# Patient Record
Sex: Female | Born: 1940 | Race: White | Hispanic: No | Marital: Married | Smoking: Never smoker
Health system: Southern US, Community
[De-identification: ages and names within clinical notes are randomized; demographics above are authoritative.]

## PROBLEM LIST (undated history)

## (undated) DIAGNOSIS — R5381 Other malaise: Secondary | ICD-10-CM

## (undated) DIAGNOSIS — R413 Other amnesia: Secondary | ICD-10-CM

## (undated) DIAGNOSIS — I10 Essential (primary) hypertension: Secondary | ICD-10-CM

## (undated) DIAGNOSIS — F32A Depression, unspecified: Secondary | ICD-10-CM

## (undated) DIAGNOSIS — R5383 Other fatigue: Secondary | ICD-10-CM

## (undated) DIAGNOSIS — E785 Hyperlipidemia, unspecified: Secondary | ICD-10-CM

## (undated) DIAGNOSIS — R251 Tremor, unspecified: Secondary | ICD-10-CM

## (undated) DIAGNOSIS — E559 Vitamin D deficiency, unspecified: Secondary | ICD-10-CM

## (undated) DIAGNOSIS — I6529 Occlusion and stenosis of unspecified carotid artery: Secondary | ICD-10-CM

## (undated) DIAGNOSIS — M810 Age-related osteoporosis without current pathological fracture: Secondary | ICD-10-CM

## (undated) DIAGNOSIS — F329 Major depressive disorder, single episode, unspecified: Secondary | ICD-10-CM

## (undated) HISTORY — DX: Other fatigue: R53.83

## (undated) HISTORY — DX: Hyperlipidemia, unspecified: E78.5

## (undated) HISTORY — DX: Major depressive disorder, single episode, unspecified: F32.9

## (undated) HISTORY — DX: Other amnesia: R41.3

## (undated) HISTORY — DX: Other malaise: R53.81

## (undated) HISTORY — DX: Occlusion and stenosis of unspecified carotid artery: I65.29

## (undated) HISTORY — PX: ARTHROSCOPY KNEE W/ DRILLING: SUR92

## (undated) HISTORY — DX: Depression, unspecified: F32.A

## (undated) HISTORY — DX: Vitamin D deficiency, unspecified: E55.9

## (undated) HISTORY — DX: Age-related osteoporosis without current pathological fracture: M81.0

## (undated) HISTORY — DX: Tremor, unspecified: R25.1

## (undated) HISTORY — DX: Essential (primary) hypertension: I10

---

## 2012-01-07 DIAGNOSIS — Z6826 Body mass index (BMI) 26.0-26.9, adult: Secondary | ICD-10-CM | POA: Diagnosis not present

## 2012-01-07 DIAGNOSIS — M810 Age-related osteoporosis without current pathological fracture: Secondary | ICD-10-CM | POA: Diagnosis not present

## 2012-01-07 DIAGNOSIS — E785 Hyperlipidemia, unspecified: Secondary | ICD-10-CM | POA: Diagnosis not present

## 2012-01-07 DIAGNOSIS — I10 Essential (primary) hypertension: Secondary | ICD-10-CM | POA: Diagnosis not present

## 2012-02-06 DIAGNOSIS — H35039 Hypertensive retinopathy, unspecified eye: Secondary | ICD-10-CM | POA: Diagnosis not present

## 2012-02-06 DIAGNOSIS — H2510 Age-related nuclear cataract, unspecified eye: Secondary | ICD-10-CM | POA: Diagnosis not present

## 2012-02-20 DIAGNOSIS — Z1231 Encounter for screening mammogram for malignant neoplasm of breast: Secondary | ICD-10-CM | POA: Diagnosis not present

## 2012-02-20 DIAGNOSIS — M899 Disorder of bone, unspecified: Secondary | ICD-10-CM | POA: Diagnosis not present

## 2012-05-10 DIAGNOSIS — M810 Age-related osteoporosis without current pathological fracture: Secondary | ICD-10-CM | POA: Diagnosis not present

## 2012-05-10 DIAGNOSIS — I10 Essential (primary) hypertension: Secondary | ICD-10-CM | POA: Diagnosis not present

## 2012-05-10 DIAGNOSIS — Z6826 Body mass index (BMI) 26.0-26.9, adult: Secondary | ICD-10-CM | POA: Diagnosis not present

## 2012-05-10 DIAGNOSIS — E785 Hyperlipidemia, unspecified: Secondary | ICD-10-CM | POA: Diagnosis not present

## 2012-09-10 DIAGNOSIS — E785 Hyperlipidemia, unspecified: Secondary | ICD-10-CM | POA: Diagnosis not present

## 2012-09-10 DIAGNOSIS — Z6825 Body mass index (BMI) 25.0-25.9, adult: Secondary | ICD-10-CM | POA: Diagnosis not present

## 2012-09-10 DIAGNOSIS — I10 Essential (primary) hypertension: Secondary | ICD-10-CM | POA: Diagnosis not present

## 2013-01-14 DIAGNOSIS — M810 Age-related osteoporosis without current pathological fracture: Secondary | ICD-10-CM | POA: Diagnosis not present

## 2013-01-14 DIAGNOSIS — E785 Hyperlipidemia, unspecified: Secondary | ICD-10-CM | POA: Diagnosis not present

## 2013-01-14 DIAGNOSIS — Z6825 Body mass index (BMI) 25.0-25.9, adult: Secondary | ICD-10-CM | POA: Diagnosis not present

## 2013-01-14 DIAGNOSIS — Z0000 Encounter for general adult medical examination without abnormal findings: Secondary | ICD-10-CM | POA: Diagnosis not present

## 2013-01-14 DIAGNOSIS — I10 Essential (primary) hypertension: Secondary | ICD-10-CM | POA: Diagnosis not present

## 2013-01-19 DIAGNOSIS — I658 Occlusion and stenosis of other precerebral arteries: Secondary | ICD-10-CM | POA: Diagnosis not present

## 2013-01-19 DIAGNOSIS — I6529 Occlusion and stenosis of unspecified carotid artery: Secondary | ICD-10-CM | POA: Diagnosis not present

## 2013-02-21 DIAGNOSIS — Z1231 Encounter for screening mammogram for malignant neoplasm of breast: Secondary | ICD-10-CM | POA: Diagnosis not present

## 2013-05-17 DIAGNOSIS — Z6826 Body mass index (BMI) 26.0-26.9, adult: Secondary | ICD-10-CM | POA: Diagnosis not present

## 2013-05-17 DIAGNOSIS — T783XXA Angioneurotic edema, initial encounter: Secondary | ICD-10-CM | POA: Diagnosis not present

## 2013-05-19 DIAGNOSIS — L500 Allergic urticaria: Secondary | ICD-10-CM | POA: Diagnosis not present

## 2013-05-19 DIAGNOSIS — T783XXA Angioneurotic edema, initial encounter: Secondary | ICD-10-CM | POA: Diagnosis not present

## 2013-05-24 DIAGNOSIS — Z6826 Body mass index (BMI) 26.0-26.9, adult: Secondary | ICD-10-CM | POA: Diagnosis not present

## 2013-05-24 DIAGNOSIS — Z9181 History of falling: Secondary | ICD-10-CM | POA: Diagnosis not present

## 2013-05-24 DIAGNOSIS — Z1331 Encounter for screening for depression: Secondary | ICD-10-CM | POA: Diagnosis not present

## 2013-05-24 DIAGNOSIS — E785 Hyperlipidemia, unspecified: Secondary | ICD-10-CM | POA: Diagnosis not present

## 2013-05-24 DIAGNOSIS — I10 Essential (primary) hypertension: Secondary | ICD-10-CM | POA: Diagnosis not present

## 2013-05-24 DIAGNOSIS — E559 Vitamin D deficiency, unspecified: Secondary | ICD-10-CM | POA: Diagnosis not present

## 2013-06-06 DIAGNOSIS — T783XXA Angioneurotic edema, initial encounter: Secondary | ICD-10-CM | POA: Diagnosis not present

## 2013-06-29 DIAGNOSIS — T783XXA Angioneurotic edema, initial encounter: Secondary | ICD-10-CM | POA: Diagnosis not present

## 2013-07-01 DIAGNOSIS — R1013 Epigastric pain: Secondary | ICD-10-CM | POA: Diagnosis not present

## 2013-07-01 DIAGNOSIS — T783XXA Angioneurotic edema, initial encounter: Secondary | ICD-10-CM | POA: Diagnosis not present

## 2013-07-01 DIAGNOSIS — E069 Thyroiditis, unspecified: Secondary | ICD-10-CM | POA: Diagnosis not present

## 2013-07-01 DIAGNOSIS — T887XXA Unspecified adverse effect of drug or medicament, initial encounter: Secondary | ICD-10-CM | POA: Diagnosis not present

## 2013-07-01 DIAGNOSIS — K3189 Other diseases of stomach and duodenum: Secondary | ICD-10-CM | POA: Diagnosis not present

## 2013-09-28 DIAGNOSIS — E559 Vitamin D deficiency, unspecified: Secondary | ICD-10-CM | POA: Diagnosis not present

## 2013-09-28 DIAGNOSIS — I10 Essential (primary) hypertension: Secondary | ICD-10-CM | POA: Diagnosis not present

## 2013-09-28 DIAGNOSIS — M810 Age-related osteoporosis without current pathological fracture: Secondary | ICD-10-CM | POA: Diagnosis not present

## 2013-09-28 DIAGNOSIS — E785 Hyperlipidemia, unspecified: Secondary | ICD-10-CM | POA: Diagnosis not present

## 2013-12-06 DIAGNOSIS — H2510 Age-related nuclear cataract, unspecified eye: Secondary | ICD-10-CM | POA: Diagnosis not present

## 2014-02-02 DIAGNOSIS — Z0000 Encounter for general adult medical examination without abnormal findings: Secondary | ICD-10-CM | POA: Diagnosis not present

## 2014-02-02 DIAGNOSIS — Z79899 Other long term (current) drug therapy: Secondary | ICD-10-CM | POA: Diagnosis not present

## 2014-02-02 DIAGNOSIS — E785 Hyperlipidemia, unspecified: Secondary | ICD-10-CM | POA: Diagnosis not present

## 2014-02-02 DIAGNOSIS — Z1212 Encounter for screening for malignant neoplasm of rectum: Secondary | ICD-10-CM | POA: Diagnosis not present

## 2014-02-02 DIAGNOSIS — Z6826 Body mass index (BMI) 26.0-26.9, adult: Secondary | ICD-10-CM | POA: Diagnosis not present

## 2014-02-02 DIAGNOSIS — I10 Essential (primary) hypertension: Secondary | ICD-10-CM | POA: Diagnosis not present

## 2014-03-03 DIAGNOSIS — Z1231 Encounter for screening mammogram for malignant neoplasm of breast: Secondary | ICD-10-CM | POA: Diagnosis not present

## 2014-03-03 DIAGNOSIS — M899 Disorder of bone, unspecified: Secondary | ICD-10-CM | POA: Diagnosis not present

## 2014-03-03 DIAGNOSIS — M949 Disorder of cartilage, unspecified: Secondary | ICD-10-CM | POA: Diagnosis not present

## 2014-06-05 DIAGNOSIS — E785 Hyperlipidemia, unspecified: Secondary | ICD-10-CM | POA: Diagnosis not present

## 2014-06-05 DIAGNOSIS — M810 Age-related osteoporosis without current pathological fracture: Secondary | ICD-10-CM | POA: Diagnosis not present

## 2014-06-05 DIAGNOSIS — E559 Vitamin D deficiency, unspecified: Secondary | ICD-10-CM | POA: Diagnosis not present

## 2014-06-05 DIAGNOSIS — I10 Essential (primary) hypertension: Secondary | ICD-10-CM | POA: Diagnosis not present

## 2014-10-17 DIAGNOSIS — M810 Age-related osteoporosis without current pathological fracture: Secondary | ICD-10-CM | POA: Diagnosis not present

## 2014-10-17 DIAGNOSIS — Z6827 Body mass index (BMI) 27.0-27.9, adult: Secondary | ICD-10-CM | POA: Diagnosis not present

## 2014-10-17 DIAGNOSIS — I10 Essential (primary) hypertension: Secondary | ICD-10-CM | POA: Diagnosis not present

## 2014-10-17 DIAGNOSIS — E559 Vitamin D deficiency, unspecified: Secondary | ICD-10-CM | POA: Diagnosis not present

## 2014-10-17 DIAGNOSIS — Z23 Encounter for immunization: Secondary | ICD-10-CM | POA: Diagnosis not present

## 2014-10-17 DIAGNOSIS — E785 Hyperlipidemia, unspecified: Secondary | ICD-10-CM | POA: Diagnosis not present

## 2015-02-20 DIAGNOSIS — Z1389 Encounter for screening for other disorder: Secondary | ICD-10-CM | POA: Diagnosis not present

## 2015-02-20 DIAGNOSIS — E785 Hyperlipidemia, unspecified: Secondary | ICD-10-CM | POA: Diagnosis not present

## 2015-02-20 DIAGNOSIS — Z6827 Body mass index (BMI) 27.0-27.9, adult: Secondary | ICD-10-CM | POA: Diagnosis not present

## 2015-02-20 DIAGNOSIS — N289 Disorder of kidney and ureter, unspecified: Secondary | ICD-10-CM | POA: Diagnosis not present

## 2015-02-20 DIAGNOSIS — Z9181 History of falling: Secondary | ICD-10-CM | POA: Diagnosis not present

## 2015-02-20 DIAGNOSIS — I10 Essential (primary) hypertension: Secondary | ICD-10-CM | POA: Diagnosis not present

## 2015-02-20 DIAGNOSIS — Z1231 Encounter for screening mammogram for malignant neoplasm of breast: Secondary | ICD-10-CM | POA: Diagnosis not present

## 2015-02-20 DIAGNOSIS — M810 Age-related osteoporosis without current pathological fracture: Secondary | ICD-10-CM | POA: Diagnosis not present

## 2015-03-05 DIAGNOSIS — Z1231 Encounter for screening mammogram for malignant neoplasm of breast: Secondary | ICD-10-CM | POA: Diagnosis not present

## 2015-04-02 DIAGNOSIS — Z6827 Body mass index (BMI) 27.0-27.9, adult: Secondary | ICD-10-CM | POA: Diagnosis not present

## 2015-04-02 DIAGNOSIS — J309 Allergic rhinitis, unspecified: Secondary | ICD-10-CM | POA: Diagnosis not present

## 2015-04-02 DIAGNOSIS — R05 Cough: Secondary | ICD-10-CM | POA: Diagnosis not present

## 2015-06-26 DIAGNOSIS — I6523 Occlusion and stenosis of bilateral carotid arteries: Secondary | ICD-10-CM | POA: Diagnosis not present

## 2015-06-26 DIAGNOSIS — Z9181 History of falling: Secondary | ICD-10-CM | POA: Diagnosis not present

## 2015-06-26 DIAGNOSIS — I10 Essential (primary) hypertension: Secondary | ICD-10-CM | POA: Diagnosis not present

## 2015-06-26 DIAGNOSIS — E559 Vitamin D deficiency, unspecified: Secondary | ICD-10-CM | POA: Diagnosis not present

## 2015-06-26 DIAGNOSIS — Z6826 Body mass index (BMI) 26.0-26.9, adult: Secondary | ICD-10-CM | POA: Diagnosis not present

## 2015-06-26 DIAGNOSIS — Z23 Encounter for immunization: Secondary | ICD-10-CM | POA: Diagnosis not present

## 2015-06-26 DIAGNOSIS — Z1231 Encounter for screening mammogram for malignant neoplasm of breast: Secondary | ICD-10-CM | POA: Diagnosis not present

## 2015-06-26 DIAGNOSIS — Z0000 Encounter for general adult medical examination without abnormal findings: Secondary | ICD-10-CM | POA: Diagnosis not present

## 2015-06-26 DIAGNOSIS — Z1389 Encounter for screening for other disorder: Secondary | ICD-10-CM | POA: Diagnosis not present

## 2015-06-27 DIAGNOSIS — N289 Disorder of kidney and ureter, unspecified: Secondary | ICD-10-CM | POA: Diagnosis not present

## 2015-07-03 DIAGNOSIS — I6523 Occlusion and stenosis of bilateral carotid arteries: Secondary | ICD-10-CM | POA: Diagnosis not present

## 2015-10-08 DIAGNOSIS — Z6827 Body mass index (BMI) 27.0-27.9, adult: Secondary | ICD-10-CM | POA: Diagnosis not present

## 2015-10-08 DIAGNOSIS — J208 Acute bronchitis due to other specified organisms: Secondary | ICD-10-CM | POA: Diagnosis not present

## 2015-10-17 DIAGNOSIS — Z6826 Body mass index (BMI) 26.0-26.9, adult: Secondary | ICD-10-CM | POA: Diagnosis not present

## 2015-10-17 DIAGNOSIS — R0989 Other specified symptoms and signs involving the circulatory and respiratory systems: Secondary | ICD-10-CM | POA: Diagnosis not present

## 2015-10-17 DIAGNOSIS — R918 Other nonspecific abnormal finding of lung field: Secondary | ICD-10-CM | POA: Diagnosis not present

## 2015-10-17 DIAGNOSIS — J208 Acute bronchitis due to other specified organisms: Secondary | ICD-10-CM | POA: Diagnosis not present

## 2015-10-19 DIAGNOSIS — I2510 Atherosclerotic heart disease of native coronary artery without angina pectoris: Secondary | ICD-10-CM | POA: Diagnosis not present

## 2015-10-19 DIAGNOSIS — N261 Atrophy of kidney (terminal): Secondary | ICD-10-CM | POA: Diagnosis not present

## 2015-10-19 DIAGNOSIS — R918 Other nonspecific abnormal finding of lung field: Secondary | ICD-10-CM | POA: Diagnosis not present

## 2015-10-22 DIAGNOSIS — Z6827 Body mass index (BMI) 27.0-27.9, adult: Secondary | ICD-10-CM | POA: Diagnosis not present

## 2015-10-22 DIAGNOSIS — J189 Pneumonia, unspecified organism: Secondary | ICD-10-CM | POA: Diagnosis not present

## 2015-10-31 DIAGNOSIS — J309 Allergic rhinitis, unspecified: Secondary | ICD-10-CM | POA: Diagnosis not present

## 2015-10-31 DIAGNOSIS — I10 Essential (primary) hypertension: Secondary | ICD-10-CM | POA: Diagnosis not present

## 2015-10-31 DIAGNOSIS — M79602 Pain in left arm: Secondary | ICD-10-CM | POA: Diagnosis not present

## 2015-10-31 DIAGNOSIS — J189 Pneumonia, unspecified organism: Secondary | ICD-10-CM | POA: Diagnosis not present

## 2015-10-31 DIAGNOSIS — Z6827 Body mass index (BMI) 27.0-27.9, adult: Secondary | ICD-10-CM | POA: Diagnosis not present

## 2015-10-31 DIAGNOSIS — N289 Disorder of kidney and ureter, unspecified: Secondary | ICD-10-CM | POA: Diagnosis not present

## 2015-10-31 DIAGNOSIS — E785 Hyperlipidemia, unspecified: Secondary | ICD-10-CM | POA: Diagnosis not present

## 2015-12-12 DIAGNOSIS — J208 Acute bronchitis due to other specified organisms: Secondary | ICD-10-CM | POA: Diagnosis not present

## 2015-12-12 DIAGNOSIS — Z6827 Body mass index (BMI) 27.0-27.9, adult: Secondary | ICD-10-CM | POA: Diagnosis not present

## 2016-01-23 DIAGNOSIS — Z6827 Body mass index (BMI) 27.0-27.9, adult: Secondary | ICD-10-CM | POA: Diagnosis not present

## 2016-01-23 DIAGNOSIS — R413 Other amnesia: Secondary | ICD-10-CM | POA: Diagnosis not present

## 2016-01-23 DIAGNOSIS — F419 Anxiety disorder, unspecified: Secondary | ICD-10-CM | POA: Diagnosis not present

## 2016-01-23 DIAGNOSIS — G250 Essential tremor: Secondary | ICD-10-CM | POA: Diagnosis not present

## 2016-01-23 DIAGNOSIS — I10 Essential (primary) hypertension: Secondary | ICD-10-CM | POA: Diagnosis not present

## 2016-03-14 DIAGNOSIS — I10 Essential (primary) hypertension: Secondary | ICD-10-CM | POA: Diagnosis not present

## 2016-03-14 DIAGNOSIS — E785 Hyperlipidemia, unspecified: Secondary | ICD-10-CM | POA: Diagnosis not present

## 2016-03-14 DIAGNOSIS — N289 Disorder of kidney and ureter, unspecified: Secondary | ICD-10-CM | POA: Diagnosis not present

## 2016-03-14 DIAGNOSIS — Z1231 Encounter for screening mammogram for malignant neoplasm of breast: Secondary | ICD-10-CM | POA: Diagnosis not present

## 2016-03-14 DIAGNOSIS — J309 Allergic rhinitis, unspecified: Secondary | ICD-10-CM | POA: Diagnosis not present

## 2016-03-14 DIAGNOSIS — G250 Essential tremor: Secondary | ICD-10-CM | POA: Diagnosis not present

## 2016-03-14 DIAGNOSIS — Z6827 Body mass index (BMI) 27.0-27.9, adult: Secondary | ICD-10-CM | POA: Diagnosis not present

## 2016-04-04 DIAGNOSIS — Z1231 Encounter for screening mammogram for malignant neoplasm of breast: Secondary | ICD-10-CM | POA: Diagnosis not present

## 2016-06-20 DIAGNOSIS — N289 Disorder of kidney and ureter, unspecified: Secondary | ICD-10-CM | POA: Diagnosis not present

## 2016-06-20 DIAGNOSIS — J309 Allergic rhinitis, unspecified: Secondary | ICD-10-CM | POA: Diagnosis not present

## 2016-06-20 DIAGNOSIS — E559 Vitamin D deficiency, unspecified: Secondary | ICD-10-CM | POA: Diagnosis not present

## 2016-06-20 DIAGNOSIS — I10 Essential (primary) hypertension: Secondary | ICD-10-CM | POA: Diagnosis not present

## 2016-06-20 DIAGNOSIS — E785 Hyperlipidemia, unspecified: Secondary | ICD-10-CM | POA: Diagnosis not present

## 2016-06-20 DIAGNOSIS — M8589 Other specified disorders of bone density and structure, multiple sites: Secondary | ICD-10-CM | POA: Diagnosis not present

## 2016-06-27 DIAGNOSIS — R05 Cough: Secondary | ICD-10-CM | POA: Diagnosis not present

## 2016-07-02 DIAGNOSIS — F419 Anxiety disorder, unspecified: Secondary | ICD-10-CM | POA: Diagnosis not present

## 2016-07-02 DIAGNOSIS — Z1389 Encounter for screening for other disorder: Secondary | ICD-10-CM | POA: Diagnosis not present

## 2016-07-02 DIAGNOSIS — R05 Cough: Secondary | ICD-10-CM | POA: Diagnosis not present

## 2016-07-02 DIAGNOSIS — Z9181 History of falling: Secondary | ICD-10-CM | POA: Diagnosis not present

## 2016-07-02 DIAGNOSIS — R413 Other amnesia: Secondary | ICD-10-CM | POA: Diagnosis not present

## 2016-10-02 DIAGNOSIS — Z6828 Body mass index (BMI) 28.0-28.9, adult: Secondary | ICD-10-CM | POA: Diagnosis not present

## 2016-10-02 DIAGNOSIS — R413 Other amnesia: Secondary | ICD-10-CM | POA: Diagnosis not present

## 2016-10-02 DIAGNOSIS — Z23 Encounter for immunization: Secondary | ICD-10-CM | POA: Diagnosis not present

## 2016-10-02 DIAGNOSIS — E785 Hyperlipidemia, unspecified: Secondary | ICD-10-CM | POA: Diagnosis not present

## 2016-10-02 DIAGNOSIS — J309 Allergic rhinitis, unspecified: Secondary | ICD-10-CM | POA: Diagnosis not present

## 2016-10-02 DIAGNOSIS — I10 Essential (primary) hypertension: Secondary | ICD-10-CM | POA: Diagnosis not present

## 2016-10-02 DIAGNOSIS — E559 Vitamin D deficiency, unspecified: Secondary | ICD-10-CM | POA: Diagnosis not present

## 2016-10-02 DIAGNOSIS — N289 Disorder of kidney and ureter, unspecified: Secondary | ICD-10-CM | POA: Diagnosis not present

## 2016-10-02 DIAGNOSIS — G250 Essential tremor: Secondary | ICD-10-CM | POA: Diagnosis not present

## 2017-01-02 DIAGNOSIS — I10 Essential (primary) hypertension: Secondary | ICD-10-CM | POA: Diagnosis not present

## 2017-01-02 DIAGNOSIS — R413 Other amnesia: Secondary | ICD-10-CM | POA: Diagnosis not present

## 2017-01-02 DIAGNOSIS — G250 Essential tremor: Secondary | ICD-10-CM | POA: Diagnosis not present

## 2017-01-02 DIAGNOSIS — E785 Hyperlipidemia, unspecified: Secondary | ICD-10-CM | POA: Diagnosis not present

## 2017-01-02 DIAGNOSIS — Z9181 History of falling: Secondary | ICD-10-CM | POA: Diagnosis not present

## 2017-01-02 DIAGNOSIS — E663 Overweight: Secondary | ICD-10-CM | POA: Diagnosis not present

## 2017-01-02 DIAGNOSIS — N289 Disorder of kidney and ureter, unspecified: Secondary | ICD-10-CM | POA: Diagnosis not present

## 2017-01-02 DIAGNOSIS — Z6827 Body mass index (BMI) 27.0-27.9, adult: Secondary | ICD-10-CM | POA: Diagnosis not present

## 2017-01-02 DIAGNOSIS — E559 Vitamin D deficiency, unspecified: Secondary | ICD-10-CM | POA: Diagnosis not present

## 2017-01-06 DIAGNOSIS — Z1231 Encounter for screening mammogram for malignant neoplasm of breast: Secondary | ICD-10-CM | POA: Diagnosis not present

## 2017-01-06 DIAGNOSIS — R413 Other amnesia: Secondary | ICD-10-CM | POA: Diagnosis not present

## 2017-01-06 DIAGNOSIS — N959 Unspecified menopausal and perimenopausal disorder: Secondary | ICD-10-CM | POA: Diagnosis not present

## 2017-01-06 DIAGNOSIS — Z9181 History of falling: Secondary | ICD-10-CM | POA: Diagnosis not present

## 2017-01-06 DIAGNOSIS — Z1389 Encounter for screening for other disorder: Secondary | ICD-10-CM | POA: Diagnosis not present

## 2017-01-06 DIAGNOSIS — Z0000 Encounter for general adult medical examination without abnormal findings: Secondary | ICD-10-CM | POA: Diagnosis not present

## 2017-01-06 DIAGNOSIS — Z136 Encounter for screening for cardiovascular disorders: Secondary | ICD-10-CM | POA: Diagnosis not present

## 2017-01-06 DIAGNOSIS — Z23 Encounter for immunization: Secondary | ICD-10-CM | POA: Diagnosis not present

## 2017-04-06 DIAGNOSIS — M8580 Other specified disorders of bone density and structure, unspecified site: Secondary | ICD-10-CM | POA: Diagnosis not present

## 2017-04-06 DIAGNOSIS — Z1231 Encounter for screening mammogram for malignant neoplasm of breast: Secondary | ICD-10-CM | POA: Diagnosis not present

## 2017-04-06 DIAGNOSIS — M85852 Other specified disorders of bone density and structure, left thigh: Secondary | ICD-10-CM | POA: Diagnosis not present

## 2017-04-06 DIAGNOSIS — N959 Unspecified menopausal and perimenopausal disorder: Secondary | ICD-10-CM | POA: Diagnosis not present

## 2017-05-04 DIAGNOSIS — Z6827 Body mass index (BMI) 27.0-27.9, adult: Secondary | ICD-10-CM | POA: Diagnosis not present

## 2017-05-04 DIAGNOSIS — N289 Disorder of kidney and ureter, unspecified: Secondary | ICD-10-CM | POA: Diagnosis not present

## 2017-05-04 DIAGNOSIS — E785 Hyperlipidemia, unspecified: Secondary | ICD-10-CM | POA: Diagnosis not present

## 2017-05-04 DIAGNOSIS — I10 Essential (primary) hypertension: Secondary | ICD-10-CM | POA: Diagnosis not present

## 2017-05-04 DIAGNOSIS — E559 Vitamin D deficiency, unspecified: Secondary | ICD-10-CM | POA: Diagnosis not present

## 2017-05-04 DIAGNOSIS — R413 Other amnesia: Secondary | ICD-10-CM | POA: Diagnosis not present

## 2017-05-04 DIAGNOSIS — G250 Essential tremor: Secondary | ICD-10-CM | POA: Diagnosis not present

## 2017-09-07 DIAGNOSIS — N289 Disorder of kidney and ureter, unspecified: Secondary | ICD-10-CM | POA: Diagnosis not present

## 2017-09-07 DIAGNOSIS — E785 Hyperlipidemia, unspecified: Secondary | ICD-10-CM | POA: Diagnosis not present

## 2017-09-07 DIAGNOSIS — G250 Essential tremor: Secondary | ICD-10-CM | POA: Diagnosis not present

## 2017-09-07 DIAGNOSIS — Z23 Encounter for immunization: Secondary | ICD-10-CM | POA: Diagnosis not present

## 2017-09-07 DIAGNOSIS — I10 Essential (primary) hypertension: Secondary | ICD-10-CM | POA: Diagnosis not present

## 2017-09-07 DIAGNOSIS — E559 Vitamin D deficiency, unspecified: Secondary | ICD-10-CM | POA: Diagnosis not present

## 2017-09-07 DIAGNOSIS — E663 Overweight: Secondary | ICD-10-CM | POA: Diagnosis not present

## 2017-12-08 DIAGNOSIS — H3562 Retinal hemorrhage, left eye: Secondary | ICD-10-CM | POA: Diagnosis not present

## 2017-12-08 DIAGNOSIS — H353220 Exudative age-related macular degeneration, left eye, stage unspecified: Secondary | ICD-10-CM | POA: Diagnosis not present

## 2017-12-08 DIAGNOSIS — H2513 Age-related nuclear cataract, bilateral: Secondary | ICD-10-CM | POA: Diagnosis not present

## 2017-12-18 DIAGNOSIS — Z6828 Body mass index (BMI) 28.0-28.9, adult: Secondary | ICD-10-CM | POA: Diagnosis not present

## 2017-12-18 DIAGNOSIS — J208 Acute bronchitis due to other specified organisms: Secondary | ICD-10-CM | POA: Diagnosis not present

## 2018-01-11 DIAGNOSIS — G250 Essential tremor: Secondary | ICD-10-CM | POA: Diagnosis not present

## 2018-01-11 DIAGNOSIS — I6523 Occlusion and stenosis of bilateral carotid arteries: Secondary | ICD-10-CM | POA: Diagnosis not present

## 2018-01-11 DIAGNOSIS — Z1331 Encounter for screening for depression: Secondary | ICD-10-CM | POA: Diagnosis not present

## 2018-01-11 DIAGNOSIS — E559 Vitamin D deficiency, unspecified: Secondary | ICD-10-CM | POA: Diagnosis not present

## 2018-01-11 DIAGNOSIS — Z1389 Encounter for screening for other disorder: Secondary | ICD-10-CM | POA: Diagnosis not present

## 2018-01-11 DIAGNOSIS — Z9181 History of falling: Secondary | ICD-10-CM | POA: Diagnosis not present

## 2018-01-11 DIAGNOSIS — Z6828 Body mass index (BMI) 28.0-28.9, adult: Secondary | ICD-10-CM | POA: Diagnosis not present

## 2018-01-11 DIAGNOSIS — E785 Hyperlipidemia, unspecified: Secondary | ICD-10-CM | POA: Diagnosis not present

## 2018-01-11 DIAGNOSIS — M810 Age-related osteoporosis without current pathological fracture: Secondary | ICD-10-CM | POA: Diagnosis not present

## 2018-01-11 DIAGNOSIS — I10 Essential (primary) hypertension: Secondary | ICD-10-CM | POA: Diagnosis not present

## 2018-01-11 DIAGNOSIS — N289 Disorder of kidney and ureter, unspecified: Secondary | ICD-10-CM | POA: Diagnosis not present

## 2018-01-18 DIAGNOSIS — I6523 Occlusion and stenosis of bilateral carotid arteries: Secondary | ICD-10-CM | POA: Diagnosis not present

## 2018-01-26 DIAGNOSIS — N959 Unspecified menopausal and perimenopausal disorder: Secondary | ICD-10-CM | POA: Diagnosis not present

## 2018-01-26 DIAGNOSIS — Z136 Encounter for screening for cardiovascular disorders: Secondary | ICD-10-CM | POA: Diagnosis not present

## 2018-01-26 DIAGNOSIS — Z1331 Encounter for screening for depression: Secondary | ICD-10-CM | POA: Diagnosis not present

## 2018-01-26 DIAGNOSIS — Z1231 Encounter for screening mammogram for malignant neoplasm of breast: Secondary | ICD-10-CM | POA: Diagnosis not present

## 2018-01-26 DIAGNOSIS — Z9181 History of falling: Secondary | ICD-10-CM | POA: Diagnosis not present

## 2018-01-26 DIAGNOSIS — Z0000 Encounter for general adult medical examination without abnormal findings: Secondary | ICD-10-CM | POA: Diagnosis not present

## 2018-01-26 DIAGNOSIS — E785 Hyperlipidemia, unspecified: Secondary | ICD-10-CM | POA: Diagnosis not present

## 2018-04-12 DIAGNOSIS — Z1231 Encounter for screening mammogram for malignant neoplasm of breast: Secondary | ICD-10-CM | POA: Diagnosis not present

## 2018-05-11 DIAGNOSIS — G250 Essential tremor: Secondary | ICD-10-CM | POA: Diagnosis not present

## 2018-05-11 DIAGNOSIS — I10 Essential (primary) hypertension: Secondary | ICD-10-CM | POA: Diagnosis not present

## 2018-05-11 DIAGNOSIS — M810 Age-related osteoporosis without current pathological fracture: Secondary | ICD-10-CM | POA: Diagnosis not present

## 2018-05-11 DIAGNOSIS — E785 Hyperlipidemia, unspecified: Secondary | ICD-10-CM | POA: Diagnosis not present

## 2018-07-09 DIAGNOSIS — I10 Essential (primary) hypertension: Secondary | ICD-10-CM | POA: Diagnosis not present

## 2018-07-09 DIAGNOSIS — G2581 Restless legs syndrome: Secondary | ICD-10-CM | POA: Diagnosis not present

## 2018-07-09 DIAGNOSIS — R413 Other amnesia: Secondary | ICD-10-CM | POA: Diagnosis not present

## 2018-07-09 DIAGNOSIS — G250 Essential tremor: Secondary | ICD-10-CM | POA: Diagnosis not present

## 2018-09-10 DIAGNOSIS — M810 Age-related osteoporosis without current pathological fracture: Secondary | ICD-10-CM | POA: Diagnosis not present

## 2018-09-10 DIAGNOSIS — Z1339 Encounter for screening examination for other mental health and behavioral disorders: Secondary | ICD-10-CM | POA: Diagnosis not present

## 2018-09-10 DIAGNOSIS — G250 Essential tremor: Secondary | ICD-10-CM | POA: Diagnosis not present

## 2018-09-10 DIAGNOSIS — E785 Hyperlipidemia, unspecified: Secondary | ICD-10-CM | POA: Diagnosis not present

## 2018-09-10 DIAGNOSIS — Z23 Encounter for immunization: Secondary | ICD-10-CM | POA: Diagnosis not present

## 2018-09-10 DIAGNOSIS — I10 Essential (primary) hypertension: Secondary | ICD-10-CM | POA: Diagnosis not present

## 2018-09-10 DIAGNOSIS — E538 Deficiency of other specified B group vitamins: Secondary | ICD-10-CM | POA: Diagnosis not present

## 2018-09-13 ENCOUNTER — Encounter: Payer: Self-pay | Admitting: Neurology

## 2018-09-16 ENCOUNTER — Encounter: Payer: Self-pay | Admitting: Neurology

## 2018-09-17 NOTE — Progress Notes (Addendum)
Belinda Davis was seen today in the movement disorders clinic for neurologic consultation at the request of Moon, Amy A, NP.  The consultation is for the evaluation of tremor and memory change.  This patient is accompanied in the office by her spouse who supplements the history. Tremor: Yes.     How long has it been going on? 2 years  At rest or with activation?  With activation.  She is L handed.  When is it noted the most?  Eating; reading newspaper  Fam hx of tremor?  No.  Located where?  L hand  Affected by caffeine:  Unknown - doesn't drink much  Affected by alcohol: unknown - doesn't drink  Affected by stress:  Yes.    Affected by fatigue:  No.  Spills soup if on spoon:  No.  Spills glass of liquid if full:  No.  Affects ADL's (tying shoes, brushing teeth, etc):  No.  Tremor inducing meds:  No. (on pramipexole 0.125 mg, 1 in the AM, 2 at night but that is for rls - no tingling, no need to move the legs, but legs will just jerk at night)  Other Specific Symptoms:  Voice: no change Sleep: sleeps well  Vivid Dreams:  No.  Acting out dreams:  No. Wet Pillows: No. Postural symptoms:  No.  Falls?  No. Bradykinesia symptoms: slow movements and difficulty getting out of a chair Loss of smell:  Yes.   Loss of taste:  No. Urinary Incontinence:  No. Difficulty Swallowing:  No. Handwriting, micrographia: No. Depression:  No. Memory changes:  Yes.  , just had mmse at pcp and was 28.  Husband notes trouble with numbers, dates.  Living situation:  Pt lives with their spouse.  The patient does not do the finances in the home.  Spouse has done that task for a long time.  The patient does drive.   There have not been any motor vehicle accidents in the recent years.  She has no trouble getting to familiar places.  The patient does  cook.  There is no difficulty remembering common recipes.  The stovetop has not been left on accidentally.  Behavior:   Family and patient report problems  with none.   There have been no behavioral changes over the years.   N/V:  No. Lightheaded:  No.  Syncope: No. Diplopia:  No. Dyskinesia:  No.  Pt had an MRI brain at [facility] today before she came here.    PREVIOUS MEDICATIONS: none to date  ALLERGIES:  No Known Allergies  CURRENT MEDICATIONS:  Outpatient Encounter Medications as of 09/20/2018  Medication Sig  . aspirin EC 81 MG tablet Take 81 mg by mouth daily.  . cholecalciferol (VITAMIN D) 1000 units tablet Take 1,000 Units by mouth daily.  . citalopram (CELEXA) 10 MG tablet Take 10 mg by mouth daily.  . hydrochlorothiazide (HYDRODIURIL) 25 MG tablet Take 25 mg by mouth daily.  Marland Kitchen losartan (COZAAR) 100 MG tablet Take 100 mg by mouth daily.  . montelukast (SINGULAIR) 10 MG tablet Take 10 mg by mouth at bedtime.  . pramipexole (MIRAPEX) 0.125 MG tablet Take 0.125 mg by mouth 3 (three) times daily.  . pravastatin (PRAVACHOL) 40 MG tablet Take 40 mg by mouth daily.  . [DISCONTINUED] alendronate (FOSAMAX) 70 MG tablet Take 70 mg by mouth once a week. Take with a full glass of water on an empty stomach.   No facility-administered encounter medications on file as of 09/20/2018.  PAST MEDICAL HISTORY:   Past Medical History:  Diagnosis Date  . Carotid artery narrowing   . Depression   . Dyslipidemia   . Essential hypertension   . Malaise and fatigue   . Memory loss   . Osteoporosis   . Tremor   . Vitamin D deficiency     PAST SURGICAL HISTORY:   Past Surgical History:  Procedure Laterality Date  . ARTHROSCOPY KNEE W/ DRILLING      SOCIAL HISTORY:   Social History   Socioeconomic History  . Marital status: Married    Spouse name: Not on file  . Number of children: Not on file  . Years of education: Not on file  . Highest education level: Not on file  Occupational History  . Occupation: retired    Comment: Radio broadcast assistant  Social Needs  . Financial resource strain: Not on file  . Food insecurity:    Worry:  Not on file    Inability: Not on file  . Transportation needs:    Medical: Not on file    Non-medical: Not on file  Tobacco Use  . Smoking status: Never Smoker  . Smokeless tobacco: Never Used  Substance and Sexual Activity  . Alcohol use: Not Currently  . Drug use: Never  . Sexual activity: Not on file  Lifestyle  . Physical activity:    Days per week: Not on file    Minutes per session: Not on file  . Stress: Not on file  Relationships  . Social connections:    Talks on phone: Not on file    Gets together: Not on file    Attends religious service: Not on file    Active member of club or organization: Not on file    Attends meetings of clubs or organizations: Not on file    Relationship status: Not on file  . Intimate partner violence:    Fear of current or ex partner: Not on file    Emotionally abused: Not on file    Physically abused: Not on file    Forced sexual activity: Not on file  Other Topics Concern  . Not on file  Social History Narrative  . Not on file    FAMILY HISTORY:   Family Status  Relation Name Status  . Mother  Deceased  . Father  Deceased  . Sister 3 Alive  . Brother 1 Alive  . Brother 3 Deceased  . Son  Alive  . Daughter  Alive    ROS:  Review of Systems  Constitutional: Negative.   HENT: Negative.   Eyes: Negative.   Respiratory: Negative.   Cardiovascular: Negative.   Gastrointestinal: Negative.   Musculoskeletal: Negative.   Skin: Negative.   Neurological: Negative.   Endo/Heme/Allergies: Negative.     PHYSICAL EXAMINATION:    VITALS:   Vitals:   09/20/18 1436  BP: 116/76  Pulse: 70  SpO2: 94%  Weight: 156 lb (70.8 kg)  Height: 5\' 2"  (1.575 m)    GEN:  The patient appears stated age and is in NAD. HEENT:  Normocephalic, atraumatic.  The mucous membranes are moist. The superficial temporal arteries are without ropiness or tenderness. CV:  RRR Lungs:  CTAB Neck/HEME:  There are no carotid bruits  bilaterally.  Neurological examination:  Orientation:  Montreal Cognitive Assessment  09/20/2018  Visuospatial/ Executive (0/5) 2  Naming (0/3) 2  Attention: Read list of digits (0/2) 1  Attention: Read list of letters (0/1) 1  Attention:  Serial 7 subtraction starting at 100 (0/3) 2  Language: Repeat phrase (0/2) 2  Language : Fluency (0/1) 1  Abstraction (0/2) 2  Delayed Recall (0/5) 0  Orientation (0/6) 4  Total 17  Adjusted Score (based on education) 18   Cranial nerves: There is good facial symmetry.  There is facial hypomimia.  Pupils are equal round and reactive to light bilaterally. Fundoscopic exam reveals clear margins bilaterally. Extraocular muscles are intact. The visual fields are full to confrontational testing. The speech is fluent and clear. Soft palate rises symmetrically and there is no tongue deviation. Hearing is intact to conversational tone. Sensation: Sensation is intact to light and pinprick throughout (facial, trunk, extremities). Vibration is intact at the bilateral big toe. There is no extinction with double simultaneous stimulation. There is no sensory dermatomal level identified. Motor: Strength is 5/5 in the bilateral upper and lower extremities.   Shoulder shrug is equal and symmetric.  There is no pronator drift. Deep tendon reflexes: Deep tendon reflexes are 3/4 at the bilateral biceps, triceps, brachioradialis, patella and achilles. Plantar responses are downgoing bilaterally.  Movement examination: Tone: There is mild increased tone in the LUE/LLE Abnormal movements: There is LUE resting tremor that increases with distraction Coordination:  There is mild decremation with RAM's, with any form of RAMS, including alternating supination and pronation of the forearm, hand opening and closing, finger taps, heel taps and toe taps on the left Gait and Station: The patient has no difficulty arising out of a deep-seated chair without the use of the hands. The  patient's stride length is decreased.  Neg pull test  Labs:  Lab work is completed on October 25th, 2019.  Sodium was 140, potassium 4.7, chloride 101, CO2 24, BUN 22, creatinine 1.18.  AST 26, ALT 20, TSH 1.090.  White blood cells were 6.2, hemoglobin 12.0, hematocrit 35.5 and platelets 218.  Addendum: B12 just received.  This was done on September 10, 2018 and was 403.  ASSESSMENT/PLAN:  1.  Parkinsonism.  I suspect that this does represent idiopathic Parkinson's disease.  The patient has tremor, bradykinesia, rigidity and mild postural instability.  -We discussed the diagnosis as well as pathophysiology of the disease.  We discussed treatment options as well as prognostic indicators.  Patient education was provided.  -We discussed that it used to be thought that levodopa would increase risk of melanoma but now it is believed that Parkinsons itself likely increases risk of melanoma. she is to get regular skin checks.  -Greater than 50% of the 60 minute visit was spent in counseling answering questions and talking about what to expect now as well as in the future.  We talked about medication options as well as potential future surgical options.  We talked about safety in the home.  -We decided to add carbidopa/levodopa 25/100.  1/2 tab tid x 1 wk, then 1/2 in am & noon & 1 at night for a week, then 1/2 in am &1 at noon &night for a week, then 1 po tid.  Risks, benefits, side effects and alternative therapies were discussed.  The opportunity to ask questions was given and they were answered to the best of my ability.  The patient expressed understanding and willingness to follow the outlined treatment protocols.  -We discussed physical therapy, but decided to hold on that until we start her on the levodopa and see how she does.  -We discussed community resources in the area including patient support groups and community exercise programs  for PD and pt education was provided to the patient.  Recommended  rock steady boxing in [place].  -Patient had an MRI of the brain today in [place].  I will try to get a copy of that.  2.  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  This was prescribed for restless leg, but her symptoms sound more like periodic limb movement disorder than restless leg.  Regardless, once she gets on levodopa I may end up taking her off of pramipexole, primarily because of memory change.  She may need extended release levodopa at bedtime to help with this.  3.  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  Once we get her medications adjusted, we will see whether or not we should pursue neurocognitive testing.  4.  Follow-up in the next few months, sooner should new neurologic issues arise.   Cc:  Lowella Dandy, NP

## 2018-09-20 ENCOUNTER — Ambulatory Visit (INDEPENDENT_AMBULATORY_CARE_PROVIDER_SITE_OTHER): Payer: Medicare Other | Admitting: Neurology

## 2018-09-20 ENCOUNTER — Encounter: Payer: Self-pay | Admitting: Neurology

## 2018-09-20 VITALS — BP 116/76 | HR 70 | Ht 62.0 in | Wt 156.0 lb

## 2018-09-20 DIAGNOSIS — G4761 Periodic limb movement disorder: Secondary | ICD-10-CM

## 2018-09-20 DIAGNOSIS — R413 Other amnesia: Secondary | ICD-10-CM

## 2018-09-20 DIAGNOSIS — G20 Parkinson's disease: Secondary | ICD-10-CM

## 2018-09-20 MED ORDER — CARBIDOPA-LEVODOPA 25-100 MG PO TABS: 1.0000 | ORAL_TABLET | Freq: Three times a day (TID) | ORAL | 3 refills | Status: DC

## 2018-09-20 NOTE — Patient Instructions (Addendum)
1. Stay on Pramipexole   2. Start Carbidopa Levodopa as follows:  Take 1/2 tablet three times daily, at least 30 minutes before meals, for one week  Then take 1/2 tablet in the morning, 1/2 tablet in the afternoon, 1 tablet in the evening, at least 30 minutes before meals, for one week  Then take 1/2 tablet in the morning, 1 tablet in the afternoon, 1 tablet in the evening, at least 30 minutes before meals, for one week  Then take 1 tablet three times daily, at least 30 minutes before meals  Community Parkinson's Exercise Programs   Parkinson's Wellness Recovery Exercise Programs:   PWR! Moves PD Exercise Class:  This is a therapist-led exercise class for people with Parkinson's disease in the [place] community. It consists of a one-hour exercise class each week. Classes are offered in eight-week sessions, and the cost per session is $80. Class size is limited to a maximum of 20 participants. Participant criteria includes: Participant must be able to get up and down from the floor with minimal to no assistance, have had 0-1 falls in the past 6 months, and have completed physical or occupational therapy at [facility] within the past year.  To find out more about session dates, questions, or to register, please contact Mady Haagensen, Physical Therapist, or Nita Sells, Physical Brewing technologist, at [facility] at [phone].  PWR! Circuit Class:  This is a therapist-led exercise class with intervals of circuit activities incorporating PWR! Moves into functional activities. It consists of one 45-minute exercise class per week. Classes are offered in eight-week sessions, and the cost per session is $120. Class size is limited to a maximum of eight participants to allow for hands-on instruction. Participant criteria: class is ideal for people with Parkinson's disease who have completed PWR! Moves Exercise Class or who are currently  independently exercising and want to be challenged, must be able to walk independently with 0-1 falls in the past 6 months, able to get up and down from the floor independently, able to sit to stand independently, and able to jog 20 feet.   To find out more about session dates, questions, or to register, please contact Mady Haagensen, Physical Therapist, or Nita Sells, Physical Brewing technologist, at [facility] at [phone].   [employer] Parkinson's Cycle:   Parkinson's Cycle Class at [facility] This is an ongoing class on Monday and Thursday mornings at 10:45 a.m. A healthcare provider referral is required to enroll. This class is FREE to participants, and you do not have to be a member of the [employer] to enroll. Contact Beth at [phone] or [email or web address]. Parkinson's Cycle Class at [facility] Ongoing Class Monday, Wednesday, and Friday mornings at 9:00 a.m. A healthcare provider referral is required to enroll. This class is FREE to participants, and you do not have to be a member of the [employer] to enroll. Contact Marlee at [phone] or [email or web address]. Parkinson's Cycle Class at [facility] Ongoing Class every Friday mornings at 12 p.m.  A healthcare provider referral is required to enroll. This class is FREE to participants, and you do not have to be a member of the [employer] to enroll. Contact [phone].  Parkinson's Cycle Class at [facility] Ongoing Class every Monday at 12pm.  A healthcare provider referral is required to enroll. This class is FREE to participants, and you do not have to be a member of the [employer] to enroll. Contact Almyra Free at [phone]  or  [email or web address].   Rock Steady Boxing:  [employer]  Classes are offered Mondays at 5:15 p.m. and Tuesdays and Thursdays at 12 p.m. at [employer]. For more information, contact  [phone] or visit [email or web address] or [email or web address]. [employer] [place] Classes are offered Monday, Wednesday, and Friday from 9:30 a.m. - 11:00 a.m. For more information, contact [phone] or [phone] or email [email or web address] or visit [email or web address] or [email or web address]. [employer] (classes are offered at 2 locations) . [employer] in [place] (for more information, contact [place] at [phone] or email [email or web address] . Cathren Laine at [facility] (class is open to the public -- for more information, contact Clabe Seal at [phone] or email [email or web address]) [place] are held at [facility] in [place], [place]. For more information, call Dr. Bing Plume at [phone] or [email or web address].   Personal Training for Parkinson's:   [employer] Offers certified personal training to customize a program to meet your exercise needs to address Parkinson's disease. For more information, contact [phone] or visit [email or web address].  Community Dance for Parkinson's:   Community dance class for people with Parkinson's Disease Wednesdays at 9 a.m. [employer] [place], [place] [address] Please contact Eliberto Ivory [phone] for more information  Scholarships Available for Fitness Programs:  The [place] for [employer] is a non-profit 501(C)3 organization run by volunteers, whose mission is to strive to empower those living with Parkinson's Disease (PD), Progressive Supra-Nuclear Palsy (PSP) and Multiple System Atrophy ([place]).  Through financial support, recipients benefit from individual and group programs. [phone] [email or web address]

## 2018-09-22 ENCOUNTER — Telehealth: Payer: Self-pay | Admitting: Neurology

## 2018-09-22 NOTE — Telephone Encounter (Signed)
Spoke with patient's husband and gave information:  Start Carbidopa Levodopa as follows:  Take 1/2 tablet three times daily, at least 30 minutes before meals, for one week  Then take 1/2 tablet in the morning, 1/2 tablet in the afternoon, 1 tablet in the evening, at least 30 minutes before meals, for one week  Then take 1/2 tablet in the morning, 1 tablet in the afternoon, 1 tablet in the evening, at least 30 minutes before meals, for one week  Then take 1 tablet three times daily, at least 30 minutes before meals  He will call with any questions.

## 2018-09-22 NOTE — Telephone Encounter (Signed)
Received phone call about 9:45 pm last night.  Husband was confused about titration schedule.  Explained that titration schedule was on AVS and not on bottle.  Told them that at visit as well.  Jade, call them today and give them the titration schedule again please.  Thanks!  Also, [place] MRI that was ordered by NP received (report only).  NP can give them results since she ordered but this is for my documentation.  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  Scattered sulcal calcifications L hemisphere due to prior infection of vascular calcifications.

## 2018-11-12 DIAGNOSIS — Z6828 Body mass index (BMI) 28.0-28.9, adult: Secondary | ICD-10-CM | POA: Diagnosis not present

## 2018-11-12 DIAGNOSIS — J208 Acute bronchitis due to other specified organisms: Secondary | ICD-10-CM | POA: Diagnosis not present

## 2018-12-28 NOTE — Progress Notes (Signed)
Belinda Davis was seen today for follow-up of newly diagnosed Parkinson's disease.  This patient is accompanied in the office by her spouse who supplements the history.She is now on carbidopa/levodopa 25/100, 1 tablet 3 times per day.  She has found it very helpful.  It has helped energy and she is walking better.  She denies falls.   When she first came to see me, she was on pramipexole 0.125 mg, 1 in the morning and 2 at night, for restless leg (but was described more like periodic limb movement disorder).  She is still on that medication.  She has had no hallucinations.  No lightheadedness or near syncope.  No falls since our last visit.  Husband and patient think that memory is better.    I did receive a copy of her MRI report from [facility], ordered by her nurse practitioner.  I did not get a copy of films.  The patient had moderate small vessel disease and moderate atrophy.  There was scattered sulcal calcifications in the left hemisphere.  PREVIOUS MEDICATIONS: none to date  ALLERGIES:  No Known Allergies  CURRENT MEDICATIONS:  Outpatient Encounter Medications as of 01/05/2019  Medication Sig  . carbidopa-levodopa (SINEMET IR) 25-100 MG tablet Take 1 tablet by mouth 3 (three) times daily.  . cholecalciferol (VITAMIN D) 1000 units tablet Take 1,000 Units by mouth daily.  . citalopram (CELEXA) 10 MG tablet Take 10 mg by mouth daily.  . hydrochlorothiazide (HYDRODIURIL) 25 MG tablet Take 25 mg by mouth daily.  Marland Kitchen losartan (COZAAR) 100 MG tablet Take 100 mg by mouth daily.  . montelukast (SINGULAIR) 10 MG tablet Take 10 mg by mouth at bedtime.  . pramipexole (MIRAPEX) 0.125 MG tablet Take 0.125 mg by mouth 3 (three) times daily.  . pravastatin (PRAVACHOL) 40 MG tablet Take 40 mg by mouth daily.  . [DISCONTINUED] aspirin EC 81 MG tablet Take 81 mg by mouth daily.   No facility-administered encounter medications on file as of 01/05/2019.     PAST MEDICAL HISTORY:   Past Medical  History:  Diagnosis Date  . Carotid artery narrowing   . Depression   . Dyslipidemia   . Essential hypertension   . Malaise and fatigue   . Memory loss   . Osteoporosis   . Tremor   . Vitamin D deficiency     PAST SURGICAL HISTORY:   Past Surgical History:  Procedure Laterality Date  . ARTHROSCOPY KNEE W/ DRILLING      SOCIAL HISTORY:   Social History   Socioeconomic History  . Marital status: Married    Spouse name: Not on file  . Number of children: Not on file  . Years of education: Not on file  . Highest education level: Not on file  Occupational History  . Occupation: retired    Comment: Radio broadcast assistant  Social Needs  . Financial resource strain: Not on file  . Food insecurity:    Worry: Not on file    Inability: Not on file  . Transportation needs:    Medical: Not on file    Non-medical: Not on file  Tobacco Use  . Smoking status: Never Smoker  . Smokeless tobacco: Never Used  Substance and Sexual Activity  . Alcohol use: Not Currently  . Drug use: Never  . Sexual activity: Not on file  Lifestyle  . Physical activity:    Days per week: Not on file    Minutes per session: Not on file  . Stress:  Not on file  Relationships  . Social connections:    Talks on phone: Not on file    Gets together: Not on file    Attends religious service: Not on file    Active member of club or organization: Not on file    Attends meetings of clubs or organizations: Not on file    Relationship status: Not on file  . Intimate partner violence:    Fear of current or ex partner: Not on file    Emotionally abused: Not on file    Physically abused: Not on file    Forced sexual activity: Not on file  Other Topics Concern  . Not on file  Social History Narrative  . Not on file    FAMILY HISTORY:   Family Status  Relation Name Status  . Mother  Deceased  . Father  Deceased  . Sister 3 Alive  . Brother 1 Alive  . Brother 3 Deceased  . Son  Alive  . Daughter   Alive    ROS:  Review of Systems  Constitutional: Negative.   HENT: Negative.   Eyes: Negative.   Respiratory: Negative.   Cardiovascular: Negative.   Gastrointestinal: Negative.   Genitourinary: Negative.   Skin: Negative.     PHYSICAL EXAMINATION:    VITALS:   Vitals:   01/05/19 0953  BP: 110/72  Pulse: 68  SpO2: 95%  Weight: 153 lb (69.4 kg)  Height: 5\' 2"  (1.575 m)    GEN:  The patient appears stated age and is in NAD. HEENT:  Normocephalic, atraumatic.  The mucous membranes are moist. The superficial temporal arteries are without ropiness or tenderness. CV:  RRR Lungs:  CTAB Neck/HEME:  There are no carotid bruits bilaterally.  Neurological examination:  Orientation:  Montreal Cognitive Assessment  09/20/2018  Visuospatial/ Executive (0/5) 2  Naming (0/3) 2  Attention: Read list of digits (0/2) 1  Attention: Read list of letters (0/1) 1  Attention: Serial 7 subtraction starting at 100 (0/3) 2  Language: Repeat phrase (0/2) 2  Language : Fluency (0/1) 1  Abstraction (0/2) 2  Delayed Recall (0/5) 0  Orientation (0/6) 4  Total 17  Adjusted Score (based on education) 18   . Cranial nerves: There is good facial symmetry. The speech is fluent and clear. Soft palate rises symmetrically and there is no tongue deviation. Hearing is intact to conversational tone. Sensation: Sensation is intact to light touch throughout Motor: Strength is 5/5 in the bilateral upper and lower extremities.   Shoulder shrug is equal and symmetric.  There is no pronator drift.  Movement examination: Tone: There is mild increased tone in the LUE/LLE Abnormal movements: There is intermittent mild LUE resting tremor Coordination:  There is mild decremation with finger taps on the L.  All other RAMs are normal.   Gait and Station: The patient has no difficulty arising out of a deep-seated chair without the use of the hands. The patient's stride length is good with just slight festination of  gait.  Neg pull test  Labs:  Lab work is completed on October 25th, 2019.  Sodium was 140, potassium 4.7, chloride 101, CO2 24, BUN 22, creatinine 1.18.  AST 26, ALT 20, TSH 1.090.  White blood cells were 6.2, hemoglobin 12.0, hematocrit 35.5 and platelets 218.  Addendum: B12 just received.  This was done on September 10, 2018 and was 403.  ASSESSMENT/PLAN:  1.  Idiopathic Parkinson's disease  -continue Carbidopa/levodopa 25/100, 1 tablet 3  times per day.  2.  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  This was prescribed for restless leg, but her symptoms sound more like periodic limb movement disorder than restless leg.  I am going to start weaning this off over the next few weeks given memory change.  -Add carbidopa/levodopa 50/200 CR at bedtime.  Hopefully, this will help the symptom.  If it is not, we could consider low-dose clonazepam.  3.  Memory loss  -This could just be mild cognitive impairment, but mild dementia is in the differential.  I am weaning off pramipexole because of this.  Told her that we could explore neurocognitive testing in the future.  Patient/husband actually thinks that memory is much better now that she is on levodopa.  4.  Follow up is anticipated in the next few months, sooner should new neurologic issues arise.  Much greater than 50% of this visit was spent in counseling and coordinating care.  Total face to face time:  25 min   Cc:  Moon, Amy A, NP

## 2019-01-05 ENCOUNTER — Encounter: Payer: Self-pay | Admitting: Neurology

## 2019-01-05 ENCOUNTER — Ambulatory Visit: Payer: PPO | Admitting: Neurology

## 2019-01-05 VITALS — BP 110/72 | HR 68 | Ht 62.0 in | Wt 153.0 lb

## 2019-01-05 DIAGNOSIS — G4761 Periodic limb movement disorder: Secondary | ICD-10-CM | POA: Diagnosis not present

## 2019-01-05 DIAGNOSIS — G20 Parkinson's disease: Secondary | ICD-10-CM

## 2019-01-05 DIAGNOSIS — G20A1 Parkinson's disease without dyskinesia, without mention of fluctuations: Secondary | ICD-10-CM

## 2019-01-05 DIAGNOSIS — R413 Other amnesia: Secondary | ICD-10-CM

## 2019-01-05 MED ORDER — CARBIDOPA-LEVODOPA ER 50-200 MG PO TBCR: 1.0000 | EXTENDED_RELEASE_TABLET | Freq: Every day | ORAL | 1 refills | Status: DC

## 2019-01-05 NOTE — Patient Instructions (Addendum)
1.  Decrease pramipexole 0.125 mg - drop the AM pill for a week and continue the nighttime 2 tablets for a week.  After 1 week, take pramipexole 0.125 mg, ONE tablet at night for a week and then you can stop the pramipexole.  2.  Today, start carbidopa/levodopa 50/200 CR (or ER) at bedtime  3.  Continue carbidopa/levodopa 25/100, 1 tablet three times per day, 30 min before meals.  The physicians and staff at [facility] are committed to providing excellent care. You may receive a survey requesting feedback about your experience at our office. We strive to receive "very good" responses to the survey questions. If you feel that your experience would prevent you from giving the office a "very good " response, please contact our office to try to remedy the situation. We may be reached at [phone]. Thank you for taking the time out of your busy day to complete the survey.

## 2019-01-08 ENCOUNTER — Telehealth: Payer: Self-pay | Admitting: Neurology

## 2019-01-08 NOTE — Telephone Encounter (Signed)
Received call stating that patient has been having night terrors since starting bedtime dose of carbidopa levodopa CR at bedtime to treat periodic limb movements.  She is doing fine with IR 25/100mg  three times daily.  Advised to take one IR tablet at bedtime instead.

## 2019-01-09 NOTE — Telephone Encounter (Signed)
Jade, please follow up with patient.  Bad dreaming/acting out of dreaming is part of the disease and not really part of the medication SE.

## 2019-01-10 ENCOUNTER — Telehealth: Payer: Self-pay | Admitting: Neurology

## 2019-01-10 MED ORDER — CARBIDOPA-LEVODOPA ER 25-100 MG PO TBCR: 1.0000 | EXTENDED_RELEASE_TABLET | Freq: Every day | ORAL | 1 refills | Status: DC

## 2019-01-10 NOTE — Telephone Encounter (Signed)
Okay.  Yes, go ahead and try the lowered dose cr and have her take 30 min prior to bedtime

## 2019-01-10 NOTE — Telephone Encounter (Signed)
Albesa Seen S 8 minutes ago (9:33 AM)      Husband calling in about the carbidopa levodopa PM-she did stop per the after hours nurse this weekend. It was making the patient unable to sleep and having really bad nightmares. She felt funny. He was wanting to know if there is something else she should be taking. Please call him back at [phone]. Thanks!

## 2019-01-10 NOTE — Telephone Encounter (Signed)
Will document is separate encounter already related to this issue. Message copied.

## 2019-01-10 NOTE — Addendum Note (Signed)
Addended byAnnamaria Helling on: 01/10/2019 01:19 PM   Modules accepted: Orders

## 2019-01-10 NOTE — Telephone Encounter (Signed)
Husband calling in about the carbidopa levodopa PM-she did stop per the after hours nurse this weekend. It was making the patient unable to sleep and having really bad nightmares. She felt funny. He was wanting to know if there is something else she should be taking. Please call him back at [phone]. Thanks!

## 2019-01-10 NOTE — Telephone Encounter (Signed)
Spoke with patient's husband. He states patient did start having symptoms after starting Carbidopa Levodopa 50/200 - 1 at bedtime. She did start the nightmares/acting out dreams (has never had this prior) and she was waking up very "out of it" putting her contacts in the wrong eyes, etc. She was also having some nausea and dizziness in the mornings. Since stopping medication that has gone away. I did explain the dreaming was probably related to the disease itself. She is currently taking an extra IR Levodopa at bedtime and this is helping some. She is starting to have jumping of her legs at night since reducing Pramipexole. I advised sometimes you do decrease to Carbidopa Levodopa 25/100 CR at bedtime which isn't as strong of a dose and he was receptive to this if you advised it.  Please advise.

## 2019-01-10 NOTE — Telephone Encounter (Signed)
Patient's husband made aware. RX sent to pharmacy.

## 2019-01-11 DIAGNOSIS — I10 Essential (primary) hypertension: Secondary | ICD-10-CM | POA: Diagnosis not present

## 2019-01-11 DIAGNOSIS — Z139 Encounter for screening, unspecified: Secondary | ICD-10-CM | POA: Diagnosis not present

## 2019-01-11 DIAGNOSIS — E785 Hyperlipidemia, unspecified: Secondary | ICD-10-CM | POA: Diagnosis not present

## 2019-01-11 DIAGNOSIS — M810 Age-related osteoporosis without current pathological fracture: Secondary | ICD-10-CM | POA: Diagnosis not present

## 2019-01-11 DIAGNOSIS — Z6828 Body mass index (BMI) 28.0-28.9, adult: Secondary | ICD-10-CM | POA: Diagnosis not present

## 2019-01-11 DIAGNOSIS — E559 Vitamin D deficiency, unspecified: Secondary | ICD-10-CM | POA: Diagnosis not present

## 2019-01-11 DIAGNOSIS — Z1231 Encounter for screening mammogram for malignant neoplasm of breast: Secondary | ICD-10-CM | POA: Diagnosis not present

## 2019-01-11 DIAGNOSIS — J309 Allergic rhinitis, unspecified: Secondary | ICD-10-CM | POA: Diagnosis not present

## 2019-01-11 DIAGNOSIS — N959 Unspecified menopausal and perimenopausal disorder: Secondary | ICD-10-CM | POA: Diagnosis not present

## 2019-01-17 ENCOUNTER — Other Ambulatory Visit: Payer: Self-pay | Admitting: Neurology

## 2019-01-17 MED ORDER — CLONAZEPAM 0.5 MG PO TABS: 0.2500 mg | ORAL_TABLET | Freq: Every day | ORAL | 0 refills | Status: DC

## 2019-01-17 NOTE — Telephone Encounter (Signed)
Spoke with patient. Reiterated office hours and need to call during the day.  She is taking Carbidopa Levodopa CR at bedtime. Still having issues with RLS. Agreeable to try Clonazepam.  Dr. Carles Collet - please sign order.

## 2019-01-17 NOTE — Telephone Encounter (Signed)
Pt called via after hours nurse at 10:45pm on Friday, 2/28.  Michela Pitcher that wife has not been able to sleep for days because of RLS and worried about weaning further on the pramipexole.  Currently on 1 tablet on night.  Told after hours nurse to have pt stay on that over the weekend and call today for advise but also to tell them to call in the day as several after hours calls about this issue, when he will say its been going on for days.  Jade, please call pt/husband.  Did they start the carbidopa/levodopa 25/100 CR at bedtime?  If so and not helpful, have them start klonopin, 0.5 mg, 1/2 tablet at night.  Also, reiterate importance of calling during the day as the nighttime number is really for emergencies (and we want them to use it for emergencies but not for things that have been going on for days)

## 2019-01-24 ENCOUNTER — Other Ambulatory Visit: Payer: Self-pay | Admitting: Neurology

## 2019-01-25 DIAGNOSIS — G20 Parkinson's disease: Secondary | ICD-10-CM | POA: Diagnosis not present

## 2019-01-25 DIAGNOSIS — G2581 Restless legs syndrome: Secondary | ICD-10-CM | POA: Diagnosis not present

## 2019-01-25 DIAGNOSIS — Z6828 Body mass index (BMI) 28.0-28.9, adult: Secondary | ICD-10-CM | POA: Diagnosis not present

## 2019-01-31 ENCOUNTER — Telehealth: Payer: Self-pay | Admitting: Neurology

## 2019-01-31 NOTE — Telephone Encounter (Signed)
Received a note from patient's nurse practitioner, Amy Moon, that patient/husband were adamant about not taking the clonazepam, so she restarted the patient back on pramipexole, 0.125 mg, 1 tablet at bedtime.

## 2019-02-11 ENCOUNTER — Telehealth: Payer: Self-pay | Admitting: Neurology

## 2019-02-11 NOTE — Telephone Encounter (Signed)
Spoke with patient's husband who agreed to [employer]. His cell number is [phone]. Chelsea can you please schedule?

## 2019-02-11 NOTE — Telephone Encounter (Signed)
Looks like you just documented you got a note from [facility] that patient refused Clonazepam on 01/31/2019 and she restarted Pramipexole. Please advise.

## 2019-02-11 NOTE — Telephone Encounter (Signed)
Spoke with patient's husband and gave okay for patient to take Melatonin.

## 2019-02-11 NOTE — Telephone Encounter (Signed)
Patient is scheduled for E-visit on 3/31 at 11AM BUT wanted to know if she could take melatonin at night until then because she is not sleeping/having restless legs/jerking. Please call husband and let him know if this is okay. Thanks! [phone].

## 2019-02-11 NOTE — Telephone Encounter (Signed)
I have tried to treat it but she didn't take the medication.  Happy to do an evisit if they would like to discuss in more detail

## 2019-02-11 NOTE — Telephone Encounter (Signed)
Patient's husband called regarding his wife and her having Nightmares and legs jumping. He wants her to get a good nights sleep. He mentioned the medications Carbidopa Levodopa and Pramipexole. He said she has taken Melatonin to try and rest. He is asking if she can be seen or talk with Dr. Carles Collet. Please Call. Thanks

## 2019-02-14 ENCOUNTER — Telehealth: Payer: Self-pay | Admitting: *Deleted

## 2019-02-14 ENCOUNTER — Encounter: Payer: Self-pay | Admitting: *Deleted

## 2019-02-14 NOTE — Telephone Encounter (Signed)
Virtual visit on 02-15-2019.  Medications verified.

## 2019-02-14 NOTE — Progress Notes (Signed)
Virtual Visit via Video Note The purpose of this virtual visit is to provide medical care while limiting exposure to the novel coronavirus.    Consent was obtained for video visit:  Yes.   Answered questions that patient had about telehealth interaction:  Yes.   I discussed the limitations, risks, security and privacy concerns of performing an evaluation and management service by telemedicine. I also discussed with the patient that there may be a patient responsible charge related to this service. The patient expressed understanding and agreed to proceed.  Pt location: Home Physician Location: office Name of referring provider:  Lowella Dandy, NP I connected with Belinda Davis at patients initiation/request on 02/15/2019 at 11:00 AM EDT by video enabled telemedicine application and verified that I am speaking with the correct person using two identifiers. Pt MRN:  [id number] Pt DOB:  10/25/1941  Call participants:  Pt; husband   History of Present Illness:  Patient is seen today in follow-up for her Parkinson's and periodic limb movement disorder.  She is on carbidopa/levodopa 25/100, 1 tablet 3 times per day (7:30am/11:30am/3:30).  Last visit, I was weaning her pramipexole because of memory change and added carbidopa/levodopa 50/200 at bedtime for restless leg.  She called just a few days after I did that and her husband stated that she was acting out her dreams and was waking up out of it.  My partner decreased her bedtime dose of levodopa to carbidopa/levodopa 25/100 CR.  She tried that for 2 nights and did not think it was helpful and the after-hours nurse told him to stop that.  She was started on clonazepam.  About 3 weeks after that I got a note from the patient's nurse practitioner that the patient/husband were adamant about not taking the clonazepam, so she restarted the patient back on pramipexole, but only 0.125 mg at night.  They then called me back and stated that her medicines  were not working and wanted assistance.  Pt states today that she is actually doing much better.  She is wearing shoes all day instead of socks.  She is putting a pillow underneath her legs at night and that helped.  She actually went off of the pramipexole.  She states that she was mostly worried about the side effect profile of Klonopin when she read about it in the pharmacy pamphlet.  She was worried about suicidal ideation.   Observations/Objective:   Vitals:   02/15/19 1043  Weight: 153 lb (69.4 kg)  Height: 5\' 2"  (1.575 m)    GEN:  The patient appears stated age and is in NAD.  Neurological examination:  Orientation: The patient is alert and oriented x3. Cranial nerves: There is good facial symmetry. There is mild facial hypomimia.  The speech is fluent and clear. Soft palate rises symmetrically and there is no tongue deviation. Hearing is intact to conversational tone. Motor: Strength is at least antigravity x 4.   Shoulder shrug is equal and symmetric.  There is no pronator drift.  Movement examination: Tone: unable Abnormal movements: There is intermittent left upper extremity resting tremor. Coordination:  There is no decremation at all today with any rapid alternating movements in the upper extremities.  I was unable to view the lower extremities in the video. Gait and Station: The patient has no difficulty arising out of a deep-seated chair without the use of the hands. The patient's stride length is good today.       Assessment and Plan:  1.  Idiopathic Parkinson's disease             -continue Carbidopa/levodopa 25/100, 1 tablet 3 times per day.  2.  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  I took her off of this primarily because of concerns of memory change.  -Patient/husband felt that carbidopa/levodopa 50/200 at bedtime caused nightmares, but this is more part of the disease and it is a result of medication.  Explained  this to them today.  -Patient was previously given very low-dose clonazepam 0.25 mg, to try for restless leg but did not want to take that.  Explained to them that options are limited.  She was mostly worried about the side effect profile that she read about and the patient pamphlet.  She was mostly worried about suicidal ideation.  I explained that the side effect profile and the patient pamphlet was not in regards to Parkinson's patients and this was not tested in Parkinson's patients specifically.  Explained that I understand that clonazepam is not always ideal, especially in this patient population, but that many of my patients in her age range do tolerate this low dose.  In addition, other options for restless leg and periodic limb movement are also not ideal (narcotics included).  However, she really is doing quite well right now and we decided to hold off on medication.  I just wanted to let her know that we may need to come back to this medication in the future, and she was agreeable to this consideration if necessary in the future.  3.  Memory loss             -This could just be mild cognitive impairment, but mild dementia is in the differential.  I weaned the pramipexole because of this.  We may need to consider neurocognitive testing in the future.   Follow Up Instructions:    -I discussed the assessment and treatment plan with the patient. The patient was provided an opportunity to ask questions and all were answered. The patient agreed with the plan and demonstrated an understanding of the instructions.   The patient was advised to call back or seek an in-person evaluation if the symptoms worsen or if the condition fails to improve as anticipated.    Total Time spent in visit with the patient was:  23 min, of which more than 50% of the time was spent in counseling and/or coordinating care on safety with med.   Pt understands and agrees with the plan of care outlined.     Alonza Bogus,  DO

## 2019-02-15 ENCOUNTER — Telehealth (INDEPENDENT_AMBULATORY_CARE_PROVIDER_SITE_OTHER): Payer: PPO | Admitting: Neurology

## 2019-02-15 ENCOUNTER — Encounter: Payer: Self-pay | Admitting: *Deleted

## 2019-02-15 ENCOUNTER — Encounter: Payer: Self-pay | Admitting: Neurology

## 2019-02-15 ENCOUNTER — Other Ambulatory Visit: Payer: Self-pay

## 2019-02-15 DIAGNOSIS — G20 Parkinson's disease: Secondary | ICD-10-CM

## 2019-02-15 DIAGNOSIS — G2581 Restless legs syndrome: Secondary | ICD-10-CM

## 2019-02-23 ENCOUNTER — Ambulatory Visit: Payer: PPO | Admitting: Neurology

## 2019-03-03 DIAGNOSIS — F419 Anxiety disorder, unspecified: Secondary | ICD-10-CM | POA: Diagnosis not present

## 2019-03-03 DIAGNOSIS — Z6826 Body mass index (BMI) 26.0-26.9, adult: Secondary | ICD-10-CM | POA: Diagnosis not present

## 2019-03-03 DIAGNOSIS — Z01419 Encounter for gynecological examination (general) (routine) without abnormal findings: Secondary | ICD-10-CM | POA: Diagnosis not present

## 2019-03-03 DIAGNOSIS — G2581 Restless legs syndrome: Secondary | ICD-10-CM | POA: Diagnosis not present

## 2019-03-03 DIAGNOSIS — M810 Age-related osteoporosis without current pathological fracture: Secondary | ICD-10-CM | POA: Diagnosis not present

## 2019-04-15 DIAGNOSIS — M8589 Other specified disorders of bone density and structure, multiple sites: Secondary | ICD-10-CM | POA: Diagnosis not present

## 2019-04-15 DIAGNOSIS — M85851 Other specified disorders of bone density and structure, right thigh: Secondary | ICD-10-CM | POA: Diagnosis not present

## 2019-04-15 DIAGNOSIS — Z1231 Encounter for screening mammogram for malignant neoplasm of breast: Secondary | ICD-10-CM | POA: Diagnosis not present

## 2019-05-09 ENCOUNTER — Other Ambulatory Visit: Payer: Self-pay | Admitting: Neurology

## 2019-05-10 NOTE — Telephone Encounter (Signed)
Requested Prescriptions   Pending Prescriptions Disp Refills  . clonazePAM (KLONOPIN) 0.5 MG tablet [Pharmacy Med Name: clonazePAM 0.5 MG Oral Tablet] 45 tablet 0    Sig: TAKE 1/2 (ONE-HALF) TABLET BY MOUTH AT BEDTIME   Rx last filled:01/17/19 #45 0 refills Pt last seen:02/15/19   Follow up appt scheduled:07/15/19

## 2019-05-10 NOTE — Telephone Encounter (Signed)
Provider approved 

## 2019-05-23 ENCOUNTER — Ambulatory Visit: Payer: PPO | Admitting: Neurology

## 2019-05-25 DIAGNOSIS — E785 Hyperlipidemia, unspecified: Secondary | ICD-10-CM | POA: Diagnosis not present

## 2019-05-25 DIAGNOSIS — Z136 Encounter for screening for cardiovascular disorders: Secondary | ICD-10-CM | POA: Diagnosis not present

## 2019-05-25 DIAGNOSIS — Z0000 Encounter for general adult medical examination without abnormal findings: Secondary | ICD-10-CM | POA: Diagnosis not present

## 2019-05-25 DIAGNOSIS — Z9181 History of falling: Secondary | ICD-10-CM | POA: Diagnosis not present

## 2019-05-25 DIAGNOSIS — Z1331 Encounter for screening for depression: Secondary | ICD-10-CM | POA: Diagnosis not present

## 2019-05-26 DIAGNOSIS — J309 Allergic rhinitis, unspecified: Secondary | ICD-10-CM | POA: Diagnosis not present

## 2019-05-26 DIAGNOSIS — E559 Vitamin D deficiency, unspecified: Secondary | ICD-10-CM | POA: Diagnosis not present

## 2019-05-26 DIAGNOSIS — G20 Parkinson's disease: Secondary | ICD-10-CM | POA: Diagnosis not present

## 2019-05-26 DIAGNOSIS — M810 Age-related osteoporosis without current pathological fracture: Secondary | ICD-10-CM | POA: Diagnosis not present

## 2019-05-26 DIAGNOSIS — E785 Hyperlipidemia, unspecified: Secondary | ICD-10-CM | POA: Diagnosis not present

## 2019-05-26 DIAGNOSIS — Z6826 Body mass index (BMI) 26.0-26.9, adult: Secondary | ICD-10-CM | POA: Diagnosis not present

## 2019-05-26 DIAGNOSIS — F419 Anxiety disorder, unspecified: Secondary | ICD-10-CM | POA: Diagnosis not present

## 2019-05-26 DIAGNOSIS — Z1331 Encounter for screening for depression: Secondary | ICD-10-CM | POA: Diagnosis not present

## 2019-06-29 ENCOUNTER — Other Ambulatory Visit: Payer: Self-pay | Admitting: Neurology

## 2019-06-30 DIAGNOSIS — R739 Hyperglycemia, unspecified: Secondary | ICD-10-CM | POA: Diagnosis not present

## 2019-06-30 DIAGNOSIS — N289 Disorder of kidney and ureter, unspecified: Secondary | ICD-10-CM | POA: Diagnosis not present

## 2019-06-30 DIAGNOSIS — I10 Essential (primary) hypertension: Secondary | ICD-10-CM | POA: Diagnosis not present

## 2019-06-30 DIAGNOSIS — Z6825 Body mass index (BMI) 25.0-25.9, adult: Secondary | ICD-10-CM | POA: Diagnosis not present

## 2019-07-01 NOTE — Telephone Encounter (Signed)
Provider approved 

## 2019-07-01 NOTE — Telephone Encounter (Signed)
Requested Prescriptions   Pending Prescriptions Disp Refills  . clonazePAM (KLONOPIN) 0.5 MG tablet [Pharmacy Med Name: clonazePAM 0.5 MG Oral Tablet] 45 tablet 0    Sig: TAKE 1/2 (ONE-HALF) TABLET BY MOUTH AT BEDTIME   Rx last filled: 05/10/19 #45 0 refills  Pt last seen:02/15/19  Follow up appt scheduled:07/15/19

## 2019-07-13 NOTE — Progress Notes (Signed)
Virtual Visit via Video Note The purpose of this virtual visit is to provide medical care while limiting exposure to the novel coronavirus.    Consent was obtained for video visit:  Yes.   Answered questions that patient had about telehealth interaction:  Yes.   I discussed the limitations, risks, security and privacy concerns of performing an evaluation and management service by telemedicine. I also discussed with the patient that there may be a patient responsible charge related to this service. The patient expressed understanding and agreed to proceed.  Pt location: Home Physician Location: home Name of referring provider:  Lowella Dandy, NP I connected with Belinda Davis at patients initiation/request on 07/15/2019 at 11:15 AM EDT by video enabled telemedicine application and verified that I am speaking with the correct person using two identifiers. Pt MRN:  [id number] Pt DOB:  1941-10-19  Call participants:  Pt; husband   History of Present Illness:  Patient is seen today in follow-up for her Parkinson's disease.  She is on carbidopa/levodopa 25/100, 1 tablet 3 times per day (8am/noon/4pm).  Pt denies falls.  Pt denies lightheadedness, near syncope.  No hallucinations.  Mood has been good.  She is not exercising.    In regards to dreams/nightmares, she states that she is doing well with klonopin, 0.25 mg at bedtime. Observations/Objective:   Vitals:   07/15/19 0809  Weight: 138 lb (62.6 kg)  Height: 5\' 2"  (1.575 m)    GEN:  The patient appears stated age and is in NAD.  Neurological examination:  Orientation: The patient is alert and oriented x3. Cranial nerves: There is good facial symmetry. There is mild facial hypomimia.  The speech is fluent and clear. Soft palate rises symmetrically and there is no tongue deviation. Hearing is intact to conversational tone. Motor: Strength is at least antigravity x 4.   Shoulder shrug is equal and symmetric.  There is no pronator drift.  Movement examination: Tone: unable Abnormal movements: There is intermittent left upper extremity resting tremor with ambulation Coordination:  There is no decremation at all today with any rapid alternating movements in the upper extremities.  I was unable to view the lower extremities in the video. Gait and Station: The patient has no difficulty arising out of a deep-seated chair without the use of the hands. The patient's stride length is good today and mild LUE rest tremor with ambulation     Assessment and Plan:    1.  Idiopathic Parkinson's disease             -continue Carbidopa/levodopa 25/100, 1 tablet 3 times per day.  2.  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  I took her off of this primarily because of concerns of memory change.  -Patient/husband felt that carbidopa/levodopa 50/200 at bedtime caused nightmares, but this is more part of the disease and it is a result of medication.  Explained this to them today.  -On clonazepam, 0.25 mg at bedtime.  Doing well with this.  -Discussed with the patient the importance of safe, cardiovascular exercise.  -Discussed online support groups.  Discussed online exercise for Parkinson's.  3.  Memory loss             -This could just be mild cognitive impairment, but mild dementia is in the differential.  I weaned the pramipexole because of this.  We may need to consider  neurocognitive testing in the future.   Follow Up Instructions:  Told patient that ideally, I would like to see her next visit in person, if possible.  The last 2 visits have been telemedicine.  -I discussed the assessment and treatment plan with the patient. The patient was provided an opportunity to ask questions and all were answered. The patient agreed with the plan and demonstrated an understanding of the instructions.   The patient was advised to call back or seek an in-person evaluation if the symptoms worsen  or if the condition fails to improve as anticipated.    Total Time spent in visit with the patient was:  15 min, of which more than 50% of the time was spent in counseling and/or coordinating care on safety with med.   Pt understands and agrees with the plan of care outlined.     Alonza Bogus, DO

## 2019-07-15 ENCOUNTER — Telehealth (INDEPENDENT_AMBULATORY_CARE_PROVIDER_SITE_OTHER): Payer: PPO | Admitting: Neurology

## 2019-07-15 ENCOUNTER — Encounter: Payer: Self-pay | Admitting: Neurology

## 2019-07-15 ENCOUNTER — Other Ambulatory Visit: Payer: Self-pay

## 2019-07-15 VITALS — Ht 62.0 in | Wt 138.0 lb

## 2019-07-15 DIAGNOSIS — G20 Parkinson's disease: Secondary | ICD-10-CM | POA: Diagnosis not present

## 2019-07-15 DIAGNOSIS — G4752 REM sleep behavior disorder: Secondary | ICD-10-CM | POA: Diagnosis not present

## 2019-07-15 DIAGNOSIS — G4761 Periodic limb movement disorder: Secondary | ICD-10-CM | POA: Diagnosis not present

## 2019-07-19 ENCOUNTER — Other Ambulatory Visit: Payer: Self-pay | Admitting: Neurology

## 2019-07-19 NOTE — Telephone Encounter (Signed)
Requested Prescriptions   Pending Prescriptions Disp Refills  . carbidopa-levodopa (SINEMET IR) 25-100 MG tablet [Pharmacy Med Name: Carbidopa-Levodopa 25-100 MG Oral Tablet] 270 tablet 0    Sig: TAKE 1 TABLET BY MOUTH THREE TIMES DAILY   Rx last filled:01/24/19 #270 1 REFILLS  Pt last seen:07/15/19   Follow up appt scheduled:12/20/19

## 2019-07-22 ENCOUNTER — Other Ambulatory Visit: Payer: Self-pay

## 2019-07-22 ENCOUNTER — Other Ambulatory Visit: Payer: Self-pay | Admitting: Neurology

## 2019-07-22 MED ORDER — CARBIDOPA-LEVODOPA 25-100 MG PO TABS: 1.0000 | ORAL_TABLET | Freq: Three times a day (TID) | ORAL | 0 refills | Status: DC

## 2019-07-26 DIAGNOSIS — H353122 Nonexudative age-related macular degeneration, left eye, intermediate dry stage: Secondary | ICD-10-CM | POA: Diagnosis not present

## 2019-07-26 DIAGNOSIS — H2513 Age-related nuclear cataract, bilateral: Secondary | ICD-10-CM | POA: Diagnosis not present

## 2019-07-26 DIAGNOSIS — H5203 Hypermetropia, bilateral: Secondary | ICD-10-CM | POA: Diagnosis not present

## 2019-08-30 DIAGNOSIS — H2511 Age-related nuclear cataract, right eye: Secondary | ICD-10-CM | POA: Diagnosis not present

## 2019-08-30 DIAGNOSIS — H2513 Age-related nuclear cataract, bilateral: Secondary | ICD-10-CM | POA: Diagnosis not present

## 2019-08-30 DIAGNOSIS — H25043 Posterior subcapsular polar age-related cataract, bilateral: Secondary | ICD-10-CM | POA: Diagnosis not present

## 2019-08-30 DIAGNOSIS — H25013 Cortical age-related cataract, bilateral: Secondary | ICD-10-CM | POA: Diagnosis not present

## 2019-08-30 DIAGNOSIS — H18413 Arcus senilis, bilateral: Secondary | ICD-10-CM | POA: Diagnosis not present

## 2019-08-30 DIAGNOSIS — H353131 Nonexudative age-related macular degeneration, bilateral, early dry stage: Secondary | ICD-10-CM | POA: Diagnosis not present

## 2019-09-06 ENCOUNTER — Telehealth: Payer: Self-pay | Admitting: Neurology

## 2019-09-06 DIAGNOSIS — G20 Parkinson's disease: Secondary | ICD-10-CM

## 2019-09-06 NOTE — Telephone Encounter (Signed)
Patient's spouse called to check in with the nurse about his wife's stomach upset. He said it has been ongoing since she started taking carbidopa-levodopa.  He said the after-hours nurse told him to give her some ginger ale mixed with water. The patient did drink that and use a warm compress, that seemed to help her some.

## 2019-09-06 NOTE — Telephone Encounter (Signed)
She used to be on the carbidopa/levodopa 50/200 at bedtime but we stopped that and then for a short time she was on carbidopa/levodopa 25/100 CR at bedtime.  I never intended for her to take 1/2 of that at bedtime.  This is for the RLS at bedtime.  So, yes, carbidopa/levodopa 25/100 CR, 1 at 8am, noon/4pm and bedtime

## 2019-09-06 NOTE — Telephone Encounter (Signed)
It would be doubtful especially if this just started.  I have seen her multiple times this year and she hasn't mentioned being sick.  I haven't changed meds at all since feb.

## 2019-09-06 NOTE — Telephone Encounter (Signed)
Called husband and instructed him on Dr. Doristine Devoid medication recommendations below - verbalized understanding.  He has several in the bedtime bottle so will start with the CR during the day per MD note.  He did say that she is only taking 1/2 of a tab at bedtime and thought she has always done that. Per last MD note 8/28 was only 3x/day. He did verify it was the Carbidopa/Levodopa ER 25/100 (Sinemet CR ) pill. He is aware she is to have a full tablet of it at 8 am/noon/4pm and will start this tomorrow.  Dr. Carles Collet - if she is taking 1/2 tab at bedtime should she continue on that at this time?  Will send to [facility] in [place] after verifying bedtime dose with MD.

## 2019-09-06 NOTE — Telephone Encounter (Signed)
Have him d/c the carbidopa/levodopa 25/100 IR (yellow pill) and take the carbidopa/levodopa 25/100 CR at 8am/noon/4pm and bedtime (she was already taking the CR at bedtime).  Please call in this RX.

## 2019-09-06 NOTE — Telephone Encounter (Signed)
Left message with the after hour service on 09-05-19 @ 7:24 pm  Caller states his wife gets sick to her stomach and he is wondering if her medication could be causing it. Please call

## 2019-09-07 ENCOUNTER — Telehealth: Payer: Self-pay | Admitting: Neurology

## 2019-09-07 MED ORDER — CARBIDOPA-LEVODOPA ER 25-100 MG PO TBCR: EXTENDED_RELEASE_TABLET | ORAL | 1 refills | Status: DC

## 2019-09-07 NOTE — Telephone Encounter (Signed)
No answer at [place] [id number]

## 2019-09-07 NOTE — Addendum Note (Signed)
Addended by: Jesse Fall on: 09/07/2019 04:36 PM   Modules accepted: Orders

## 2019-09-07 NOTE — Telephone Encounter (Signed)
Received call from the answering service that pts husband misunderstood directions given yesterday and instead of giving pt carbidopa/levodopa 25/100, he stopped that and gave her klonopin at 8am/noon/4pm.  Pt was unstable today and more confused so called at 6pm via answering service for directions.  I gave proper directions several times to answering service nurse which are: 1.  D/c carbidopa/levodopa 25/100 IR (yellow pill) that pt was on at 8am/noon/4pm 2.  Start carbidopa/levodopa 25/100 CR at 8am/noon/4pm 3.  Continue carbidopa/levodopa 25/100 CR, 1 tablet at bedtime 4.  Continue clonazepam, 0.5 mg, 1/2 tablet at bedtime  Was told to go to ER if pt unarousable but for now, she was just a little more unstable (b/c of klonopin).  Alyse Low, would you call pts husband in the AM and make sure that he understood.

## 2019-09-07 NOTE — Telephone Encounter (Signed)
Called husband and clarified it was a full pill at bedtime (same thing 4x day at 8/noon/4/bedtime). Verbalized understanding and he is aware it was sent to [facility]. He said she seems to be doing better today with the CR.

## 2019-09-07 NOTE — Telephone Encounter (Signed)
I contacted patients husband, They were notified of medication change already. Can close encounter

## 2019-09-07 NOTE — Telephone Encounter (Signed)
No answer at 918

## 2019-09-08 NOTE — Telephone Encounter (Signed)
No answer at 755, will call again

## 2019-09-08 NOTE — Telephone Encounter (Signed)
Pt husband did understand the message. The wife is better this am.

## 2019-09-13 ENCOUNTER — Other Ambulatory Visit: Payer: Self-pay

## 2019-09-13 ENCOUNTER — Encounter (HOSPITAL_COMMUNITY): Payer: Self-pay

## 2019-09-13 ENCOUNTER — Emergency Department (HOSPITAL_COMMUNITY): Payer: PPO

## 2019-09-13 ENCOUNTER — Inpatient Hospital Stay (HOSPITAL_COMMUNITY)
Admission: EM | Admit: 2019-09-13 | Discharge: 2019-09-15 | DRG: 101 | Disposition: A | Discharge status: Home / Self Care | Payer: PPO | Attending: Neurology | Admitting: Neurology

## 2019-09-13 DIAGNOSIS — Z20828 Contact with and (suspected) exposure to other viral communicable diseases: Secondary | ICD-10-CM | POA: Diagnosis not present

## 2019-09-13 DIAGNOSIS — G9340 Encephalopathy, unspecified: Secondary | ICD-10-CM | POA: Diagnosis present

## 2019-09-13 DIAGNOSIS — Q211 Atrial septal defect: Secondary | ICD-10-CM | POA: Diagnosis not present

## 2019-09-13 DIAGNOSIS — R410 Disorientation, unspecified: Secondary | ICD-10-CM | POA: Diagnosis present

## 2019-09-13 DIAGNOSIS — M810 Age-related osteoporosis without current pathological fracture: Secondary | ICD-10-CM | POA: Diagnosis not present

## 2019-09-13 DIAGNOSIS — I639 Cerebral infarction, unspecified: Secondary | ICD-10-CM | POA: Diagnosis not present

## 2019-09-13 DIAGNOSIS — Z8349 Family history of other endocrine, nutritional and metabolic diseases: Secondary | ICD-10-CM

## 2019-09-13 DIAGNOSIS — Z800 Family history of malignant neoplasm of digestive organs: Secondary | ICD-10-CM | POA: Diagnosis not present

## 2019-09-13 DIAGNOSIS — R29818 Other symptoms and signs involving the nervous system: Secondary | ICD-10-CM | POA: Diagnosis not present

## 2019-09-13 DIAGNOSIS — E559 Vitamin D deficiency, unspecified: Secondary | ICD-10-CM | POA: Diagnosis not present

## 2019-09-13 DIAGNOSIS — I739 Peripheral vascular disease, unspecified: Secondary | ICD-10-CM | POA: Diagnosis present

## 2019-09-13 DIAGNOSIS — H53462 Homonymous bilateral field defects, left side: Secondary | ICD-10-CM | POA: Diagnosis present

## 2019-09-13 DIAGNOSIS — Z803 Family history of malignant neoplasm of breast: Secondary | ICD-10-CM

## 2019-09-13 DIAGNOSIS — F0390 Unspecified dementia without behavioral disturbance: Secondary | ICD-10-CM | POA: Diagnosis present

## 2019-09-13 DIAGNOSIS — R4701 Aphasia: Secondary | ICD-10-CM | POA: Diagnosis present

## 2019-09-13 DIAGNOSIS — Z8249 Family history of ischemic heart disease and other diseases of the circulatory system: Secondary | ICD-10-CM | POA: Diagnosis not present

## 2019-09-13 DIAGNOSIS — I10 Essential (primary) hypertension: Secondary | ICD-10-CM | POA: Diagnosis not present

## 2019-09-13 DIAGNOSIS — E785 Hyperlipidemia, unspecified: Secondary | ICD-10-CM | POA: Diagnosis not present

## 2019-09-13 DIAGNOSIS — R414 Neurologic neglect syndrome: Secondary | ICD-10-CM | POA: Diagnosis present

## 2019-09-13 DIAGNOSIS — R569 Unspecified convulsions: Secondary | ICD-10-CM | POA: Diagnosis not present

## 2019-09-13 DIAGNOSIS — R404 Transient alteration of awareness: Secondary | ICD-10-CM | POA: Diagnosis not present

## 2019-09-13 DIAGNOSIS — I6389 Other cerebral infarction: Secondary | ICD-10-CM | POA: Diagnosis not present

## 2019-09-13 DIAGNOSIS — Z03818 Encounter for observation for suspected exposure to other biological agents ruled out: Secondary | ICD-10-CM | POA: Diagnosis not present

## 2019-09-13 DIAGNOSIS — F0280 Dementia in other diseases classified elsewhere without behavioral disturbance: Secondary | ICD-10-CM | POA: Diagnosis present

## 2019-09-13 DIAGNOSIS — Z9282 Status post administration of tPA (rtPA) in a different facility within the last 24 hours prior to admission to current facility: Secondary | ICD-10-CM | POA: Diagnosis not present

## 2019-09-13 DIAGNOSIS — R29810 Facial weakness: Secondary | ICD-10-CM | POA: Diagnosis not present

## 2019-09-13 DIAGNOSIS — N179 Acute kidney failure, unspecified: Secondary | ICD-10-CM | POA: Diagnosis not present

## 2019-09-13 DIAGNOSIS — G20 Parkinson's disease: Secondary | ICD-10-CM | POA: Diagnosis not present

## 2019-09-13 DIAGNOSIS — R29710 NIHSS score 10: Secondary | ICD-10-CM | POA: Diagnosis present

## 2019-09-13 DIAGNOSIS — Z811 Family history of alcohol abuse and dependence: Secondary | ICD-10-CM

## 2019-09-13 DIAGNOSIS — Z79899 Other long term (current) drug therapy: Secondary | ICD-10-CM | POA: Diagnosis not present

## 2019-09-13 DIAGNOSIS — I6300 Cerebral infarction due to thrombosis of unspecified precerebral artery: Secondary | ICD-10-CM | POA: Diagnosis not present

## 2019-09-13 DIAGNOSIS — R2990 Unspecified symptoms and signs involving the nervous system: Secondary | ICD-10-CM | POA: Diagnosis present

## 2019-09-13 DIAGNOSIS — F329 Major depressive disorder, single episode, unspecified: Secondary | ICD-10-CM | POA: Diagnosis present

## 2019-09-13 DIAGNOSIS — G20A1 Parkinson's disease without dyskinesia, without mention of fluctuations: Secondary | ICD-10-CM | POA: Diagnosis present

## 2019-09-13 DIAGNOSIS — I6523 Occlusion and stenosis of bilateral carotid arteries: Secondary | ICD-10-CM | POA: Diagnosis not present

## 2019-09-13 LAB — CBC
HCT: 34.7 % — ABNORMAL LOW (ref 36.0–46.0)
Hemoglobin: 11.2 g/dL — ABNORMAL LOW (ref 12.0–15.0)
MCH: 32.5 pg (ref 26.0–34.0)
MCHC: 32.3 g/dL (ref 30.0–36.0)
MCV: 100.6 fL — ABNORMAL HIGH (ref 80.0–100.0)
Platelets: 182 10*3/uL (ref 150–400)
RBC: 3.45 MIL/uL — ABNORMAL LOW (ref 3.87–5.11)
RDW: 13 % (ref 11.5–15.5)
WBC: 9.2 10*3/uL (ref 4.0–10.5)
nRBC: 0 % (ref 0.0–0.2)

## 2019-09-13 LAB — COMPREHENSIVE METABOLIC PANEL
ALT: 5 U/L (ref 0–44)
AST: 20 U/L (ref 15–41)
Albumin: 3.6 g/dL (ref 3.5–5.0)
Alkaline Phosphatase: 58 U/L (ref 38–126)
Anion gap: 10 (ref 5–15)
BUN: 26 mg/dL — ABNORMAL HIGH (ref 8–23)
CO2: 22 mmol/L (ref 22–32)
Calcium: 8.8 mg/dL — ABNORMAL LOW (ref 8.9–10.3)
Chloride: 102 mmol/L (ref 98–111)
Creatinine, Ser: 1.39 mg/dL — ABNORMAL HIGH (ref 0.44–1.00)
GFR calc Af Amer: 42 mL/min — ABNORMAL LOW (ref >60)
GFR calc non Af Amer: 36 mL/min — ABNORMAL LOW (ref >60)
Glucose, Bld: 101 mg/dL — ABNORMAL HIGH (ref 70–99)
Potassium: 4 mmol/L (ref 3.5–5.1)
Sodium: 134 mmol/L — ABNORMAL LOW (ref 135–145)
Total Bilirubin: 1.1 mg/dL (ref 0.3–1.2)
Total Protein: 7.3 g/dL (ref 6.5–8.1)

## 2019-09-13 LAB — DIFFERENTIAL
Abs Immature Granulocytes: 0.03 10*3/uL (ref 0.00–0.07)
Basophils Absolute: 0 10*3/uL (ref 0.0–0.1)
Basophils Relative: 0 %
Eosinophils Absolute: 0.1 10*3/uL (ref 0.0–0.5)
Eosinophils Relative: 1 %
Immature Granulocytes: 0 %
Lymphocytes Relative: 14 %
Lymphs Abs: 1.3 10*3/uL (ref 0.7–4.0)
Monocytes Absolute: 0.7 10*3/uL (ref 0.1–1.0)
Monocytes Relative: 7 %
Neutro Abs: 7.1 10*3/uL (ref 1.7–7.7)
Neutrophils Relative %: 78 %

## 2019-09-13 LAB — PROTIME-INR
INR: 1 (ref 0.8–1.2)
Prothrombin Time: 13.3 seconds (ref 11.4–15.2)

## 2019-09-13 LAB — I-STAT CHEM 8, ED
BUN: 28 mg/dL — ABNORMAL HIGH (ref 8–23)
Calcium, Ion: 1.1 mmol/L — ABNORMAL LOW (ref 1.15–1.40)
Chloride: 106 mmol/L (ref 98–111)
Creatinine, Ser: 1.2 mg/dL — ABNORMAL HIGH (ref 0.44–1.00)
Glucose, Bld: 96 mg/dL (ref 70–99)
HCT: 35 % — ABNORMAL LOW (ref 36.0–46.0)
Hemoglobin: 11.9 g/dL — ABNORMAL LOW (ref 12.0–15.0)
Potassium: 4.2 mmol/L (ref 3.5–5.1)
Sodium: 138 mmol/L (ref 135–145)
TCO2: 22 mmol/L (ref 22–32)

## 2019-09-13 LAB — CBG MONITORING, ED: Glucose-Capillary: 94 mg/dL (ref 70–99)

## 2019-09-13 LAB — MRSA PCR SCREENING: MRSA by PCR: NEGATIVE

## 2019-09-13 LAB — SARS CORONAVIRUS 2 (TAT 6-24 HRS): SARS Coronavirus 2: NEGATIVE

## 2019-09-13 LAB — APTT: aPTT: 27 seconds (ref 24–36)

## 2019-09-13 MED ORDER — ACETAMINOPHEN 325 MG PO TABS: 650.0000 mg | ORAL_TABLET | ORAL | Status: DC | PRN

## 2019-09-13 MED ORDER — CARBIDOPA-LEVODOPA ER 25-100 MG PO TBCR
1.0000 | EXTENDED_RELEASE_TABLET | Freq: Three times a day (TID) | ORAL | Status: DC
  Administered 2019-09-14 – 2019-09-15 (×5): 1 via ORAL
  Filled 2019-09-13 (×9): qty 1

## 2019-09-13 MED ORDER — LABETALOL HCL 5 MG/ML IV SOLN
10.0000 mg | Freq: Once | INTRAVENOUS | Status: AC
  Administered 2019-09-13: 19:00:00 20 mg via INTRAVENOUS

## 2019-09-13 MED ORDER — ACETAMINOPHEN 160 MG/5ML PO SOLN: 650.0000 mg | ORAL | Status: DC | PRN

## 2019-09-13 MED ORDER — SODIUM CHLORIDE 0.9 % IV SOLN
50.0000 mL/h | INTRAVENOUS | Status: DC
  Administered 2019-09-13 – 2019-09-14 (×2): 50 mL/h via INTRAVENOUS

## 2019-09-13 MED ORDER — STROKE: EARLY STAGES OF RECOVERY BOOK: Freq: Once | Status: DC

## 2019-09-13 MED ORDER — ACETAMINOPHEN 650 MG RE SUPP
650.0000 mg | RECTAL | Status: DC | PRN
  Administered 2019-09-14: 650 mg via RECTAL
  Filled 2019-09-13: qty 1

## 2019-09-13 MED ORDER — NICARDIPINE HCL IN NACL 20-0.86 MG/200ML-% IV SOLN: 0.0000 mg/h | INTRAVENOUS | Status: DC | PRN

## 2019-09-13 MED ORDER — SODIUM CHLORIDE 0.9% FLUSH: 3.0000 mL | Freq: Once | INTRAVENOUS | Status: DC

## 2019-09-13 MED ORDER — ALTEPLASE (STROKE) FULL DOSE INFUSION
56.8000 mg | Freq: Once | INTRAVENOUS | Status: AC
  Administered 2019-09-13: 56.8 mg via INTRAVENOUS
  Filled 2019-09-13: qty 100

## 2019-09-13 MED ORDER — IOHEXOL 350 MG/ML SOLN
80.0000 mL | Freq: Once | INTRAVENOUS | Status: AC | PRN
  Administered 2019-09-13: 19:00:00 80 mL via INTRAVENOUS

## 2019-09-13 MED ORDER — SODIUM CHLORIDE 0.9 % IV SOLN: 50.0000 mL | Freq: Once | INTRAVENOUS | Status: DC

## 2019-09-13 MED ORDER — LABETALOL HCL 5 MG/ML IV SOLN: 10.0000 mg | Freq: Once | INTRAVENOUS | Status: DC | PRN

## 2019-09-13 MED ORDER — PANTOPRAZOLE SODIUM 40 MG IV SOLR
40.0000 mg | Freq: Every day | INTRAVENOUS | Status: DC
  Administered 2019-09-14: 23:00:00 40 mg via INTRAVENOUS
  Filled 2019-09-13: qty 40

## 2019-09-13 NOTE — Progress Notes (Signed)
PHARMACIST CODE STROKE RESPONSE  Notified to mix tPA at [place] by Dr. Leonel Ramsay Delivered tPA to RN at 1830  tPA dose = 5.7mg  bolus over 1 minute followed by 51.1mg  for a total dose of 56.8mg  over 1 hour  Issues/delays encountered (if applicable): Patient's BP was elevated prior to administration. Labetolol 10 mg was given and BP was rechecked and within limits prior to TPA administration   Duanne Limerick PharmD. BCPS  09/13/19 6:46 PM

## 2019-09-13 NOTE — Code Documentation (Signed)
1739 Code stroke called. LKW 1530, Hemianopia present and L sided weakness. Initial NIH 10, 1834 Labetalol given for elevated BP, tPA started 1836.

## 2019-09-13 NOTE — ED Triage Notes (Signed)
Pt from home with [place] em[facility] for Code stroke. LSN [id number] today by husband. States the pt was sitting at the table when he got out of the shower she was not talking herself. Pt alert, disoriented to time and place. Complete left side neglect noted. Hx of parkinsons. Pt taken to CT2 with stroke team upon arrival to ED

## 2019-09-13 NOTE — H&P (Signed)
Neurology H&P  CC: Confusion  History is obtained from: Patient, husband  HPI: Belinda Davis is a 78 y.o. female who was in her normal state of health when her husband spoke to her around 3:30 PM.  He then took a shower and when he emerged from the shower, he found her sitting at the table and acting confused.  She then tried to speak to her daughter but was unable to appropriately use the phone.  Subsequently 911 was called and EMS activated a code stroke when they recognize the hemianopia.  She was taken for stat head CT which revealed no acute changes and CTA which revealed no large vessel occlusion.  Given her symptoms, risks and benefits of IV TPA were discussed with husband who agreed to proceed.   LKW: 3:30 PM tpa given?: yes IR Thrombectomy? No, no large vessel occlusion Modified Rankin Scale: 3-Moderate disability-requires help but walks WITHOUT assistance NIHSS:    ROS: A complete ROS was performed and is negative except as noted in the HPI.   Past Medical History:  Diagnosis Date  . Carotid artery narrowing   . Depression   . Dyslipidemia   . Essential hypertension   . Malaise and fatigue   . Memory loss   . Osteoporosis   . Tremor   . Vitamin D deficiency      Family History  Problem Relation Age of Onset  . Breast cancer Mother   . Alcoholism Father        ETOH abuse  . Hypertension Sister   . Hypercholesterolemia Brother   . Alcoholism Brother   . Liver cancer Brother   . Healthy Son   . Healthy Daughter      Social History:  reports that she has never smoked. She has never used smokeless tobacco. She reports previous alcohol use. She reports that she does not use drugs.   Prior to Admission medications   Medication Sig Start Date End Date Taking? Authorizing Provider  Carbidopa-Levodopa ER (SINEMET CR) 25-100 MG tablet controlled release Take one tablet at 8 am / noon / 4pm / bedtime. 09/07/19   Tat, Eustace Quail, DO  cholecalciferol (VITAMIN D)  1000 units tablet Take 1,000 Units by mouth daily.    [provider]  clonazePAM (KLONOPIN) 0.5 MG tablet TAKE 1/2 (ONE-HALF) TABLET BY MOUTH AT BEDTIME 07/01/19   Tat, Rebecca S, DO  losartan (COZAAR) 100 MG tablet Take 100 mg by mouth daily.    [provider]  pravastatin (PRAVACHOL) 40 MG tablet Take 40 mg by mouth daily.    [provider]     Exam: Current vital signs: BP (!) 188/76   Pulse 65   Temp 97.6 F (36.4 C) (Axillary)   Resp 20   Wt 63 kg   SpO2 99%   BMI 25.41 kg/m    Physical Exam  Constitutional: Appears well-developed and well-nourished.  Psych: Affect appropriate to situation Eyes: No scleral injection HENT: No OP obstrucion Head: Normocephalic.  Cardiovascular: Normal rate and regular rhythm.  Respiratory: Effort normal and breath sounds normal to anterior ascultation GI: Soft.  No distension. There is no tenderness.  Skin: WDI  Neuro: Mental Status: Patient is awake, alert, oriented to person, place, month, year, and situation. Patient is able to give a clear and coherent history. She does not attend to the left side as well as the right, incorrectly identified stimuli localization Cranial Nerves: II: Left hemianopia. pupils are equal, round, and reactive to light.  III,IV, VI: She has a right gaze preference, but does cross midline to the left V: Facial sensation is symmetric to temperature VII: Facial movement with mild left facial weakness VIII: hearing is intact to voice X: Uvula elevates symmetrically XI: Shoulder shrug is symmetric. XII: tongue is midline without atrophy or fasciculations.  Motor:  5/5 strength was present in all four extremities.  Sensory: Sensation is symmetric to light touch and temperature in the arms and legs.  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   Secondary Diagnosis: Essential (primary) hypertension and Acute Kidney Failure   Impression: 78 year old female with acute neglect/hemianopia most consistent with acute ischemic infarct.  She was within the time window for IV TPA and this has been administered.  She will need to be admitted for post TPA management to the ICU.  Plan: - HgbA1c, fasting lipid panel - MRI of the brain without contrast - Frequent neuro checks - Echocardiogram - Prophylactic therapy-Antiplatelet med: None for 24 hours - Risk factor modification - Telemetry monitoring - PT consult, OT consult, Speech consult - Stroke team to follow -For AKI, hydrate overnight -For Parkinson's, continue home meds   This patient is critically ill and at significant risk of neurological worsening, death and care requires constant monitoring of vital signs, hemodynamics,respiratory and cardiac monitoring, neurological assessment, discussion with family, other specialists and medical decision making of high complexity. I spent 60 minutes of neurocritical care time  in the care of  this patient. This was time spent independent of any time provided by nurse practitioner or PA.  Roland Rack, MD [facility] [phone]  If 7pm- 7am, please page neurology on call as listed in [place].

## 2019-09-13 NOTE — ED Provider Notes (Signed)
[place] EMERGENCY DEPARTMENT Provider Note   CSN: [id number] Arrival date & time: 09/13/19  1812     History   Chief Complaint Chief Complaint  Patient presents with   Code Stroke    HPI Belinda Davis is a 78 y.o. female.    Level 5 caveat due to memory loss HPI Patient presents as a code stroke.  Met in [place] with neurology.  Last normal at 330.  Husband found her confused not talking and neglecting left side.  History of Parkinson's.  Code stroke called by EMS. Past Medical History:  Diagnosis Date   Carotid artery narrowing    Depression    Dyslipidemia    Essential hypertension    Malaise and fatigue    Memory loss    Osteoporosis    Tremor    Vitamin D deficiency     Patient Active Problem List   Diagnosis Date Noted   Stroke (cerebrum) ([place]) 09/13/2019   Parkinson's disease ([place]) 02/15/2019    Past Surgical History:  Procedure Laterality Date   ARTHROSCOPY KNEE W/ DRILLING       OB History   No obstetric history on file.      Home Medications    Prior to Admission medications   Medication Sig Start Date End Date Taking? Authorizing Provider  Carbidopa-Levodopa ER (SINEMET CR) 25-100 MG tablet controlled release Take one tablet at 8 am / noon / 4pm / bedtime. 09/07/19   Tat, Eustace Quail, DO  cholecalciferol (VITAMIN D) 1000 units tablet Take 1,000 Units by mouth daily.    [provider]  clonazePAM (KLONOPIN) 0.5 MG tablet TAKE 1/2 (ONE-HALF) TABLET BY MOUTH AT BEDTIME 07/01/19   Tat, Rebecca S, DO  losartan (COZAAR) 100 MG tablet Take 100 mg by mouth daily.    [provider]  pravastatin (PRAVACHOL) 40 MG tablet Take 40 mg by mouth daily.    [provider]    Family History Family History  Problem Relation Age of Onset   Breast cancer Mother    Alcoholism Father        ETOH abuse   Hypertension Sister    Hypercholesterolemia Brother    Alcoholism Brother     Liver cancer Brother    Healthy Son    Healthy Daughter     Social History Social History   Tobacco Use   Smoking status: Never Smoker   Smokeless tobacco: Never Used  Substance Use Topics   Alcohol use: Not Currently   Drug use: Never     Allergies   Patient has no known allergies.   Review of Systems Review of Systems  Unable to perform ROS: Mental status change     Physical Exam Updated Vital Signs BP 132/86    Pulse 78    Temp 97.6 F (36.4 C) (Axillary)    Resp 16    Wt 63 kg    SpO2 96%    BMI 25.41 kg/m   Physical Exam Vitals signs and nursing note reviewed.  HENT:     Head: Normocephalic.  Eyes:     Extraocular Movements: Extraocular movements intact.  Cardiovascular:     Rate and Rhythm: Regular rhythm.  Pulmonary:     Effort: Pulmonary effort is normal.  Abdominal:     Tenderness: There is no abdominal tenderness.  Skin:    General: Skin is warm.  Neurological:     Mental Status: She is alert.     Comments: Patient  is awake and answering questions.  Some confusion.  Eye movements appear grossly intact this time.  Will squeeze bilaterally.  Does have some neglect of left side however.  More complete NIH scoring done by neurology.      ED Treatments / Results  Labs (all labs ordered are listed, but only abnormal results are displayed) Labs Reviewed  CBC - Abnormal; Notable for the following components:      Result Value   RBC 3.45 (*)    Hemoglobin 11.2 (*)    HCT 34.7 (*)    MCV 100.6 (*)    All other components within normal limits  COMPREHENSIVE METABOLIC PANEL - Abnormal; Notable for the following components:   Sodium 134 (*)    Glucose, Bld 101 (*)    BUN 26 (*)    Creatinine, Ser 1.39 (*)    Calcium 8.8 (*)    GFR calc non Af Amer 36 (*)    GFR calc Af Amer 42 (*)    All other components within normal limits  I-STAT CHEM 8, ED - Abnormal; Notable for the following components:   BUN 28 (*)    Creatinine, Ser 1.20 (*)     Calcium, Ion 1.10 (*)    Hemoglobin 11.9 (*)    HCT 35.0 (*)    All other components within normal limits  SARS CORONAVIRUS 2 (TAT 6-24 HRS)  PROTIME-INR  APTT  DIFFERENTIAL  CBC  HEMOGLOBIN A1C  LIPID PANEL  CBG MONITORING, ED  CBG MONITORING, ED    EKG EKG Interpretation  Date/Time:  Tuesday September 13 2019 18:53:04 EDT Ventricular Rate:  78 PR Interval:    QRS Duration: 130 QT Interval:  431 QTC Calculation: 491 R Axis:   85 Text Interpretation: Sinus rhythm Right bundle branch block Confirmed by Davonna Belling [phone]) on 09/13/2019 7:13:15 PM   Radiology Ct Head Code Stroke Wo Contrast  Result Date: 09/13/2019 CLINICAL DATA:  Code stroke. Focal neuro deficit, less than 6 hours, stroke suspected. Additional history provided: Left-sided neglect. EXAM: CT HEAD WITHOUT CONTRAST TECHNIQUE: Contiguous axial images were obtained from the base of the skull through the vertex without intravenous contrast. COMPARISON:  Head CT 09/20/2018 FINDINGS: Brain: No evidence of acute intracranial hemorrhage. No demarcated cortical infarction. No evidence of intracranial mass. No midline shift or extra-axial fluid collection. Ill-defined hypoattenuation within the cerebral white matter consistent with moderate chronic small vessel ischemic disease. Stable mild generalized parenchymal atrophy. Vascular: No definite hyperdense vessel. Atherosclerotic calcification of the carotid artery siphons. Skull: Normal. Negative for fracture or focal lesion. Sinuses/Orbits: Visualized orbits demonstrate no acute abnormality. Chronic left maxillary sinusitis with extensive partial opacification of the left maxillary sinus and associated reactive osteitis. Hyperdensity within the left maxillary sinus may reflect inspissated secretions and/or sequela of chronic fungal sinusitis. Mild mucosal thickening within posterior right ethmoid air cell and within the right sphenoid sinus. No significant mastoid effusion  ASPECTS ([place] Stroke Program Early CT Score) - Ganglionic level infarction (caudate, lentiform nuclei, internal capsule, insula, M1-M3 cortex): 7 - Supraganglionic infarction (M4-M6 cortex): 3 Total score (0-10 with 10 being normal): 10 These results were called by telephone at the time of interpretation on 09/13/2019 at 6:42 pm to provider Dr. Leonel Ramsay, who verbally acknowledged these results. IMPRESSION: 1. No acute intracranial hemorrhage or demarcated cortical infarction. 2. ASPECTS is 10 3. Generalized parenchymal atrophy and chronic small vessel ischemic disease. 4. Chronic left maxillary sinusitis as described Electronically Signed   By: Kellie Simmering DO  On: 09/13/2019 18:46    Procedures Procedures (including critical care time)  Medications Ordered in ED Medications  sodium chloride flush (NS) 0.9 % injection 3 mL (has no administration in time range)   stroke: mapping our early stages of recovery book (has no administration in time range)  0.9 %  sodium chloride infusion (has no administration in time range)  acetaminophen (TYLENOL) tablet 650 mg (has no administration in time range)    Or  acetaminophen (TYLENOL) 160 MG/5ML solution 650 mg (has no administration in time range)    Or  acetaminophen (TYLENOL) suppository 650 mg (has no administration in time range)  labetalol (NORMODYNE) injection 10 mg (has no administration in time range)    And  nicardipine (CARDENE) 64m in 0.86% saline 203mIV infusion (0.1 mg/ml) (has no administration in time range)  pantoprazole (PROTONIX) injection 40 mg (has no administration in time range)  alteplase (ACTIVASE) 1 mg/mL infusion 56.8 mg (56.8 mg Intravenous New Bag/Given 09/13/19 1836)    Followed by  0.9 %  sodium chloride infusion (has no administration in time range)  Carbidopa-Levodopa ER (SINEMET CR) 25-100 MG tablet controlled release 1 tablet (has no administration in time range)  iohexol (OMNIPAQUE) 350 MG/ML injection 80 mL  (80 mLs Intravenous Contrast Given 09/13/19 1835)  labetalol (NORMODYNE) injection 10 mg (20 mg Intravenous Given 09/13/19 1834)     Initial Impression / Assessment and Plan / ED Course  I have reviewed the triage vital signs and the nursing notes.  Pertinent labs & imaging results that were available during my care of the patient were reviewed by me and considered in my medical decision making (see chart for details).        Stroke.  In TPA window.  Stroke neurologist, Dr. KiLeonel Ramsayave TPA.  Admit under neurology.  Final Clinical Impressions(s) / ED Diagnoses   Final diagnoses:  [facility]vascular accident ([facility]), [facility]   ED Discharge Orders    None       PiDavonna BellingMD 09/13/19 1914

## 2019-09-14 ENCOUNTER — Inpatient Hospital Stay (HOSPITAL_COMMUNITY): Payer: PPO

## 2019-09-14 ENCOUNTER — Encounter (HOSPITAL_COMMUNITY): Payer: Self-pay | Admitting: Radiology

## 2019-09-14 ENCOUNTER — Telehealth: Payer: Self-pay | Admitting: Neurology

## 2019-09-14 DIAGNOSIS — I6389 Other cerebral infarction: Secondary | ICD-10-CM

## 2019-09-14 LAB — CBC
HCT: 29.2 % — ABNORMAL LOW (ref 36.0–46.0)
Hemoglobin: 9.8 g/dL — ABNORMAL LOW (ref 12.0–15.0)
MCH: 32.3 pg (ref 26.0–34.0)
MCHC: 33.6 g/dL (ref 30.0–36.0)
MCV: 96.4 fL (ref 80.0–100.0)
Platelets: 162 10*3/uL (ref 150–400)
RBC: 3.03 MIL/uL — ABNORMAL LOW (ref 3.87–5.11)
RDW: 13 % (ref 11.5–15.5)
WBC: 6.5 10*3/uL (ref 4.0–10.5)
nRBC: 0 % (ref 0.0–0.2)

## 2019-09-14 LAB — LIPID PANEL
Cholesterol: 152 mg/dL (ref 0–200)
HDL: 46 mg/dL (ref >40)
LDL Cholesterol: 86 mg/dL (ref 0–99)
Total CHOL/HDL Ratio: 3.3 RATIO
Triglycerides: 98 mg/dL (ref <150)
VLDL: 20 mg/dL (ref 0–40)

## 2019-09-14 LAB — ECHOCARDIOGRAM COMPLETE: Weight: 2222.4 oz

## 2019-09-14 LAB — HEMOGLOBIN A1C
Hgb A1c MFr Bld: 5.6 % (ref 4.8–5.6)
Mean Plasma Glucose: 114.02 mg/dL

## 2019-09-14 MED ORDER — ORAL CARE MOUTH RINSE
15.0000 mL | Freq: Two times a day (BID) | OROMUCOSAL | Status: DC
  Administered 2019-09-14 – 2019-09-15 (×3): 15 mL via OROMUCOSAL

## 2019-09-14 MED ORDER — LEVETIRACETAM IN NACL 1000 MG/100ML IV SOLN
1000.0000 mg | Freq: Two times a day (BID) | INTRAVENOUS | Status: DC
  Administered 2019-09-14 – 2019-09-15 (×2): 1000 mg via INTRAVENOUS
  Filled 2019-09-14 (×2): qty 100

## 2019-09-14 MED ORDER — ASPIRIN EC 325 MG PO TBEC
325.0000 mg | DELAYED_RELEASE_TABLET | Freq: Every day | ORAL | Status: DC
  Administered 2019-09-14 – 2019-09-15 (×2): 325 mg via ORAL
  Filled 2019-09-14 (×2): qty 1

## 2019-09-14 MED ORDER — PRAVASTATIN SODIUM 40 MG PO TABS
40.0000 mg | ORAL_TABLET | Freq: Every day | ORAL | Status: DC
  Administered 2019-09-14: 40 mg via ORAL
  Filled 2019-09-14: qty 1

## 2019-09-14 MED ORDER — CHLORHEXIDINE GLUCONATE CLOTH 2 % EX PADS
6.0000 | MEDICATED_PAD | Freq: Every day | CUTANEOUS | Status: DC
  Administered 2019-09-14: 6 via TOPICAL

## 2019-09-14 NOTE — Progress Notes (Signed)
vLTM started  Push buttton tested neurology notified

## 2019-09-14 NOTE — Evaluation (Signed)
Occupational Therapy Evaluation Patient Details Name: Belinda Davis MRN: [id number] DOB: Jun 25, 1941 Today's Date: 09/14/2019    History of Present Illness pt is a 78 y/o female admitted with signs of stroke including speech problems, confusion and suspected hemianopsia.  Code stroke activated by EMS, TpA given though CT showed no acute changes.  MRI pending. NIH 10   PMH: Parkinson's, osteoporosis, memory loss, carotic narrowing.   Clinical Impression   PT admitted with workup continues with confusion and L inattention. Pt currently with functional limitiations due to the deficits listed below (see OT problem list). Pt requires min (A) for ambulation due to inattention to L visual field. Pt walks with trunk rotated toward the R. Will need (A) for all adls as pt with deficits sequencing L side.  Pt will benefit from skilled OT to increase their independence and safety with adls and balance to allow discharge [place] then progress to Outpatient. Pt could benefit from spouse bring glasses.     Follow Up Recommendations  Home health OT    Equipment Recommendations  None recommended by OT    Recommendations for Other Services       Precautions / Restrictions Precautions Precautions: Fall      Mobility Bed Mobility Overal bed mobility: Modified Independent                Transfers Overall transfer level: Needs assistance   Transfers: Sit to/from Stand Sit to Stand: Supervision         General transfer comment: pt able to power up but requires tactile and verbal cues to initiate movement - slow response to commands    Balance Overall balance assessment: Needs assistance Sitting-balance support: No upper extremity supported;Feet supported Sitting balance-Leahy Scale: Good     Standing balance support: No upper extremity supported;During functional activity Standing balance-Leahy Scale: Fair                             ADL either performed or assessed  with clinical judgement   ADL Overall ADL's : Needs assistance/impaired Eating/Feeding: Minimal assistance Eating/Feeding Details (indicate cue type and reason): left inattention to plate Grooming: Minimal assistance   Upper Body Bathing: Minimal assistance   Lower Body Bathing: Minimal assistance   Upper Body Dressing : Minimal assistance   Lower Body Dressing: Minimal assistance   Toilet Transfer: Minimal assistance Toilet Transfer Details (indicate cue type and reason): due to visual deficits and only attempting to look tot he R . pt walking with R body rotation          Functional mobility during ADLs: Minimal assistance General ADL Comments: pt completed lunch and Ot returning  b      Vision Baseline Vision/History: Wears glasses;Cataracts Vision Assessment?: Yes Eye Alignment: Within Functional Limits Ocular Range of Motion: Impaired-to be further tested in functional context Alignment/Gaze Preference: Gaze right Tracking/Visual Pursuits: Decreased smoothness of horizontal tracking Saccades: Impaired - to be further tested in functional context Convergence: Impaired - to be further tested in functional context Visual Fields: Impaired-to be further tested in functional context Additional Comments: was pending surgery for catarct lens replacement 10/29 but has now been cancelled     Perception     Praxis      Pertinent Vitals/Pain Pain Assessment: No/denies pain     Hand Dominance Left   Extremity/Trunk Assessment Upper Extremity Assessment Upper Extremity Assessment: Overall WFL for tasks assessed   Lower Extremity Assessment Lower Extremity  Assessment: Overall WFL for tasks assessed   Cervical / Trunk Assessment Cervical / Trunk Assessment: Normal   Communication Communication Communication: Other (comment)(delayed responses)   Cognition Arousal/Alertness: Awake/alert Behavior During Therapy: Flat affect Overall Cognitive Status: Impaired/Different  from baseline Area of Impairment: Orientation;Attention;Memory;Following commands;Safety/judgement;Awareness;Problem solving                 Orientation Level: Disoriented to;Place;Time;Situation Current Attention Level: Sustained Memory: Decreased recall of precautions;Decreased short-term memory Following Commands: Follows one step commands inconsistently;Follows one step commands with increased time Safety/Judgement: Decreased awareness of safety;Decreased awareness of deficits Awareness: Intellectual Problem Solving: Slow processing;Decreased initiation;Difficulty sequencing General Comments: pt attempting to don sock on L foot without any sock. pt makes a face to indicate confusion that she is unable to complete task but has no awareness to the lack of sock    General Comments  VSS    Exercises     Shoulder Instructions      Home Living Family/patient expects to be discharged to:: Private residence Living Arrangements: Spouse/significant other Available Help at Discharge: Family;Available 24 hours/day Type of Home: House Home Access: Stairs to enter [employer] of Steps: 2 Entrance Stairs-Rails: None Home Layout: One level     Bathroom Shower/Tub: Teacher, early years/pre: Handicapped height     Home Equipment: None   Additional Comments: spouse name is Medical sales representative       Prior Functioning/Environment Level of Independence: Independent                 OT Problem List: Impaired balance (sitting and/or standing);Impaired vision/perception;Decreased cognition;Decreased coordination;Decreased safety awareness      OT Treatment/Interventions: Self-care/ADL training;Therapeutic exercise;Neuromuscular education;Energy conservation;DME and/or AE instruction;Manual therapy;Therapeutic activities;Cognitive remediation/compensation;Visual/perceptual remediation/compensation;Patient/family education;Balance training    OT Goals(Current goals can be  found in the care plan section) Acute Rehab OT Goals Patient Stated Goal: none stated OT Goal Formulation: With patient/family Time For Goal Achievement: 09/28/19 Potential to Achieve Goals: Good  OT Frequency: Min 2X/week   Barriers to D/C:            Co-evaluation PT/OT/SLP Co-Evaluation/Treatment: (dovetail)            AM-PAC OT "6 Clicks" Daily Activity     Outcome Measure Help from another person eating meals?: A Little Help from another person taking care of personal grooming?: A Lot Help from another person toileting, which includes using toliet, bedpan, or urinal?: A Lot Help from another person bathing (including washing, rinsing, drying)?: A Lot Help from another person to put on and taking off regular upper body clothing?: A Little Help from another person to put on and taking off regular lower body clothing?: A Lot 6 Click Score: 14   End of Session Nurse Communication: Mobility status;Precautions  Activity Tolerance: Patient tolerated treatment well Patient left: in chair;with call bell/phone within reach;with chair alarm set;with family/visitor present  OT Visit Diagnosis: Unsteadiness on feet (R26.81);Other symptoms and signs involving cognitive function                Time: 1326-1340(second arriavl - 10 minutes first arrival to position in bed) OT Time Calculation (min): 14 min Charges:  OT General Charges $OT Visit: 1 Visit OT Evaluation $OT Eval Moderate Complexity: 1 Mod   Jeri Modena, OTR/L  Acute Rehabilitation Services Pager: [phone] Office: [phone] .   Jeri Modena 09/14/2019, 3:22 PM

## 2019-09-14 NOTE — Evaluation (Signed)
Speech Language Pathology Evaluation Patient Details Name: Belinda Davis MRN: [id number] DOB: Apr 24, 1941 Today's Date: 09/14/2019 Time: [id number] SLP Time Calculation (min) (ACUTE ONLY): 16 min  Problem List:  Patient Active Problem List   Diagnosis Date Noted  . Stroke (cerebrum) ([place]) 09/13/2019  . Parkinson's disease ([place]) 02/15/2019   Past Medical History:  Past Medical History:  Diagnosis Date  . Carotid artery narrowing   . Depression   . Dyslipidemia   . Essential hypertension   . Malaise and fatigue   . Memory loss   . Osteoporosis   . Tremor   . Vitamin D deficiency    Past Surgical History:  Past Surgical History:  Procedure Laterality Date  . ARTHROSCOPY KNEE W/ DRILLING     HPI:  Belinda Davis is a 78 y.o. F with Hx of Parkinson's, memory loss, malaise, HPT. and osteoporosis admitted for confusion and hemianopia noticed by husband on 10/27. Code stroke head CT showed no acute intracranial hemorrhage with generalized parenchymal atrophy and chronic small vessel ischemic disease. Recieved tPA 10/27. MRI and CT scheduled today. Neuro reported: pt awake, alert, oriented to person, place, month, year, and situation. Patient is able to give a clear and coherent history. She does not attend to the left side as well as the right, incorrectly identified stimuli localization. Diet currently NPO.    Assessment / Plan / Recommendation Clinical Impression  SLP visited pt for cognitive evaluation which revealed significant impairments in sustained attention, awareness, memory, impulsivity, and initiation. Pt unable to provide baseline for cognition, although PMH is significant for dementia and Parkinson's. Decreased attention is a large barrier in her ability to participate and communciate her wants/needs. With verbal and tactile cueing pt can attend to task for 3-4 seconds. Verbal questions frequently require repeating before pt will answer, unable to determine if this is  due to impaired attention, initiation , or auditory comprehension during session. Pt was oriented to name but not oriented to place or time given max verbal cues. Required max phonemic cues to answer biographical question. Counting to five successful after multiple verbal cues to initiate. She correctly answered 3/7 days of the week with max cueing. Pt requires continued SLP services for cognition with recommendation for SNF.     SLP Assessment  SLP Recommendation/Assessment: Patient needs continued Speech Lanaguage Pathology Services SLP Visit Diagnosis: Cognitive communication deficit (R41.841)    Follow Up Recommendations  24 hour supervision/assistance;Home health SLP    Frequency and Duration min 2x/week  2 weeks      SLP Evaluation Cognition  Overall Cognitive Status: Impaired/Different from baseline Arousal/Alertness: Lethargic Orientation Level: Oriented to person;Disoriented to place;Disoriented to time;Disoriented to situation Attention: Sustained Sustained Attention: Impaired Sustained Attention Impairment: Functional basic;Verbal basic Memory: Impaired Memory Impairment: Other (comment)(Will assess further) Awareness: Impaired Awareness Impairment: Anticipatory impairment;Emergent impairment;Intellectual impairment Problem Solving: (P) Impaired(TBA) Behaviors: Impulsive Safety/Judgment: Impaired       Comprehension  Auditory Comprehension Overall Auditory Comprehension: Other (comment)(limited assessment- functional during assessment w/ basic) Yes/No Questions: (to be further assessed) Commands: Impaired One Step Basic Commands: 25-49% accurate Conversation: Simple Interfering Components: Attention EffectiveTechniques: Repetition Visual Recognition/Discrimination Discrimination: Not tested Reading Comprehension Reading Status: Not tested    Expression Expression Primary Mode of Expression: Verbal Verbal Expression Overall Verbal Expression:  Impaired Initiation: Impaired Automatic Speech: Counting;Name(1-5) Level of Generative/Spontaneous Verbalization: Phrase Repetition: No impairment Naming: Not tested Pragmatics: Unable to assess(Simultaneous filing. User may not have seen previous data.) Interfering Components: Attention Effective Techniques: Phonemic cues;Sentence completion Non-Verbal  Means of Communication: Not applicable Written Expression Dominant Hand: Left Written Expression: Unable to assess (comment)   Oral / Motor  Oral Motor/Sensory Function Overall Oral Motor/Sensory Function: Mild impairment Facial ROM: Reduced left Facial Symmetry: Abnormal symmetry left Motor Speech Overall Motor Speech: Appears within functional limits for tasks assessed Respiration: Within functional limits Phonation: Normal Resonance: Within functional limits Articulation: Within functional limitis Intelligibility: Intelligible Motor Planning: Witnin functional limits Motor Speech Errors: Not applicable   GO                    Edon Hoadley 09/14/2019, 2:05 PM

## 2019-09-14 NOTE — Progress Notes (Addendum)
STROKE TEAM PROGRESS NOTE   HISTORY OF PRESENT ILLNESS (per record) Belinda Davis is a 78 y.o. female who was in her normal state of health when her husband spoke to her around 3:30 PM.  He then took a shower and when he emerged from the shower, he found her sitting at the table and acting confused.  She then tried to speak to her daughter but was unable to appropriately use the phone.  Subsequently 911 was called and EMS activated a code stroke when they recognize the hemianopia.  She was taken for stat head CT which revealed no acute changes and CTA which revealed no large vessel occlusion.  Given her symptoms, risks and benefits of IV TPA were discussed with husband who agreed to proceed.   LKW: 3:30 PM tpa given?: yes IR Thrombectomy? No, no large vessel occlusion Modified Rankin Scale: 3-Moderate disability-requires help but walks WITHOUT assistance  INTERVAL HISTORY Her husband is at the bedside. He is a rather poor historian himself. But upon review of Dr Doristine Devoid out pt notes, pt is followed for PD and dementia.  I have personally reviewed history of presenting illness with the patient and husband as well as electronic medical records and imaging films in PACS.  She presented with sudden onset of confusion and left-sided neglect and field loss possibly from right MCA branch infarct and received IV TPA.  Overnight she has remained confused with right gaze preference and left-sided neglect.  Blood pressure adequately controlled.  Repeat brain imaging is pending.  EEG is pending   OBJECTIVE Vitals:   09/14/19 0900 09/14/19 1000 09/14/19 1100 09/14/19 1200  BP: (!) 103/36 (!) 111/47 (!) 105/52 (!) 108/58  Pulse: 79 73 71 64  Resp: 19 16 (!) 22 (!) 25  Temp: 99.1 F (37.3 C)   98.5 F (36.9 C)  TempSrc:    Oral  SpO2: 92% 92% 94% 93%  Weight:        CBC:  Recent Labs  Lab 09/13/19 1822 09/14/19 0820  WBC 9.2 6.5  NEUTROABS 7.1  --   HGB 11.2* 9.8*  HCT 34.7* 29.2*  MCV  100.6* 96.4  PLT 182 0000000    Basic Metabolic Panel:  Recent Labs  Lab 09/13/19 1818 09/13/19 1822  NA 138 134*  K 4.2 4.0  CL 106 102  CO2  --  22  GLUCOSE 96 101*  BUN 28* 26*  CREATININE 1.20* 1.39*  CALCIUM  --  8.8*    Lipid Panel:     Component Value Date/Time   CHOL 152 09/14/2019 0820   TRIG 98 09/14/2019 0820   HDL 46 09/14/2019 0820   CHOLHDL 3.3 09/14/2019 0820   VLDL 20 09/14/2019 0820   LDLCALC 86 09/14/2019 0820   HgbA1c:  Lab Results  Component Value Date   HGBA1C 5.6 09/14/2019   Urine Drug Screen: No results found for: LABOPIA, COCAINSCRNUR, LABBENZ, AMPHETMU, THCU, LABBARB  Alcohol Level No results found for: ETH  IMAGING   Ct Angio Head W Or Wo Contrast  Result Date: 09/13/2019 CLINICAL DATA:  Stroke code.  Left-sided neglect. EXAM: CT ANGIOGRAPHY HEAD AND NECK TECHNIQUE: Multidetector CT imaging of the head and neck was performed using the standard protocol during bolus administration of intravenous contrast. Multiplanar CT image reconstructions and MIPs were obtained to evaluate the vascular anatomy. Carotid stenosis measurements (when applicable) are obtained utilizing NASCET criteria, using the distal internal carotid diameter as the denominator. CONTRAST:  22mL OMNIPAQUE IOHEXOL 350 MG/ML SOLN  COMPARISON:  Non-contrast CT head performed earlier the same day 09/13/2011, noncontrast head CT 09/20/2018, carotid artery duplex 01/18/2018 FINDINGS: CTA NECK FINDINGS Aortic arch: Standard branching. Included portions of the aortic arch demonstrate no evidence of dissection or aneurysm. Mild calcified plaque within the proximal left subclavian artery. Right carotid system: CCA widely patent. Calcified plaque at the carotid bifurcation and within the proximal ICA with less than 50% narrowing of the proximal ICA. Distal to this, the ICA is widely patent within the neck. Left carotid system: CCA widely patent. Soft and calcified plaque within the carotid  bifurcation and proximal ICA. No measurable stenosis of the proximal ICA as compared to the more distal vessel. Distal to this, the ICA is widely patent within the neck. Vertebral arteries: Codominant. The vertebral arteries are widely patent within the neck. Skeleton: No acute bony abnormality. Cervical spondylosis with multilevel posterior disc osteophytes, uncovertebral and facet hypertrophy. Congenital nonunion of the posterior arch of C1. Other neck: No soft tissue neck mass or pathologically enlarged cervical chain lymph nodes. Thyroid negative. Upper chest: No consolidation within the imaged lung apices Review of the MIP images confirms the above findings CTA HEAD FINDINGS Anterior circulation: Mild calcified plaque within the intracranial internal carotid arteries. Mild narrowing of the paraclinoid right ICA. Motion artifact somewhat limits evaluation of the M2 and more distal MCA branch vessels bilaterally. Within this limitation, the bilateral middle and anterior cerebral arteries are patent without significant proximal stenosis identified. No intracranial aneurysm identified. Posterior circulation: The intracranial internal vertebral arteries are patent without significant stenosis. The basilar artery is patent without significant stenosis. The bilateral posterior cerebral arteries are patent without significant proximal stenosis. Venous sinuses: Within limitations of contrast timing, no convincing thrombus. Anatomic variants: None significant Review of the MIP images confirms the above findings No emergent large vessel occlusion identified. These results were called by telephone at the time of interpretation on 09/13/2019 at 6:42 p.m. to provider [facility] , who verbally acknowledged these results. IMPRESSION: CTA neck: 1. Bilateral common and internal carotid arteries patent within the neck without significant stenosis (50% or greater). Calcified plaque results in less than 50% narrowing of  the proximal right ICA. Mixed plaque at the left carotid bifurcation without measurable stenosis of the proximal left ICA. 2. Bilateral vertebral arteries widely patent within the neck. CTA head: 1. Motion degradation somewhat limits evaluation of the M2 and more distal MCA branch vessels. Within this limitation, no intracranial large vessel occlusion or proximal high-grade arterial stenosis is identified. 2. Mild atherosclerotic disease of the intracranial internal carotid arteries. Electronically Signed   By: Kellie Simmering DO   On: 09/13/2019 19:20   Ct Angio Neck W Or Wo Contrast  Result Date: 09/13/2019 CLINICAL DATA:  Stroke code.  Left-sided neglect. EXAM: CT ANGIOGRAPHY HEAD AND NECK TECHNIQUE: Multidetector CT imaging of the head and neck was performed using the standard protocol during bolus administration of intravenous contrast. Multiplanar CT image reconstructions and MIPs were obtained to evaluate the vascular anatomy. Carotid stenosis measurements (when applicable) are obtained utilizing NASCET criteria, using the distal internal carotid diameter as the denominator. CONTRAST:  4mL OMNIPAQUE IOHEXOL 350 MG/ML SOLN COMPARISON:  Non-contrast CT head performed earlier the same day 09/13/2011, noncontrast head CT 09/20/2018, carotid artery duplex 01/18/2018 FINDINGS: CTA NECK FINDINGS Aortic arch: Standard branching. Included portions of the aortic arch demonstrate no evidence of dissection or aneurysm. Mild calcified plaque within the proximal left subclavian artery. Right carotid system: CCA widely patent. Calcified  plaque at the carotid bifurcation and within the proximal ICA with less than 50% narrowing of the proximal ICA. Distal to this, the ICA is widely patent within the neck. Left carotid system: CCA widely patent. Soft and calcified plaque within the carotid bifurcation and proximal ICA. No measurable stenosis of the proximal ICA as compared to the more distal vessel. Distal to this, the ICA  is widely patent within the neck. Vertebral arteries: Codominant. The vertebral arteries are widely patent within the neck. Skeleton: No acute bony abnormality. Cervical spondylosis with multilevel posterior disc osteophytes, uncovertebral and facet hypertrophy. Congenital nonunion of the posterior arch of C1. Other neck: No soft tissue neck mass or pathologically enlarged cervical chain lymph nodes. Thyroid negative. Upper chest: No consolidation within the imaged lung apices Review of the MIP images confirms the above findings CTA HEAD FINDINGS Anterior circulation: Mild calcified plaque within the intracranial internal carotid arteries. Mild narrowing of the paraclinoid right ICA. Motion artifact somewhat limits evaluation of the M2 and more distal MCA branch vessels bilaterally. Within this limitation, the bilateral middle and anterior cerebral arteries are patent without significant proximal stenosis identified. No intracranial aneurysm identified. Posterior circulation: The intracranial internal vertebral arteries are patent without significant stenosis. The basilar artery is patent without significant stenosis. The bilateral posterior cerebral arteries are patent without significant proximal stenosis. Venous sinuses: Within limitations of contrast timing, no convincing thrombus. Anatomic variants: None significant Review of the MIP images confirms the above findings No emergent large vessel occlusion identified. These results were called by telephone at the time of interpretation on 09/13/2019 at 6:42 p.m. to provider [facility] , who verbally acknowledged these results. IMPRESSION: CTA neck: 1. Bilateral common and internal carotid arteries patent within the neck without significant stenosis (50% or greater). Calcified plaque results in less than 50% narrowing of the proximal right ICA. Mixed plaque at the left carotid bifurcation without measurable stenosis of the proximal left ICA. 2. Bilateral  vertebral arteries widely patent within the neck. CTA head: 1. Motion degradation somewhat limits evaluation of the M2 and more distal MCA branch vessels. Within this limitation, no intracranial large vessel occlusion or proximal high-grade arterial stenosis is identified. 2. Mild atherosclerotic disease of the intracranial internal carotid arteries. Electronically Signed   By: Kellie Simmering DO   On: 09/13/2019 19:20   Ct Head Code Stroke Wo Contrast  Result Date: 09/13/2019 CLINICAL DATA:  Code stroke. Focal neuro deficit, less than 6 hours, stroke suspected. Additional history provided: Left-sided neglect. EXAM: CT HEAD WITHOUT CONTRAST TECHNIQUE: Contiguous axial images were obtained from the base of the skull through the vertex without intravenous contrast. COMPARISON:  Head CT 09/20/2018 FINDINGS: Brain: No evidence of acute intracranial hemorrhage. No demarcated cortical infarction. No evidence of intracranial mass. No midline shift or extra-axial fluid collection. Ill-defined hypoattenuation within the cerebral white matter consistent with moderate chronic small vessel ischemic disease. Stable mild generalized parenchymal atrophy. Vascular: No definite hyperdense vessel. Atherosclerotic calcification of the carotid artery siphons. Skull: Normal. Negative for fracture or focal lesion. Sinuses/Orbits: Visualized orbits demonstrate no acute abnormality. Chronic left maxillary sinusitis with extensive partial opacification of the left maxillary sinus and associated reactive osteitis. Hyperdensity within the left maxillary sinus may reflect inspissated secretions and/or sequela of chronic fungal sinusitis. Mild mucosal thickening within posterior right ethmoid air cell and within the right sphenoid sinus. No significant mastoid effusion ASPECTS [facility] Stroke Program Early CT Score) - Ganglionic level infarction (caudate, lentiform nuclei, internal capsule, insula, M1-M3  cortex): 7 - Supraganglionic  infarction (M4-M6 cortex): 3 Total score (0-10 with 10 being normal): 10 These results were called by telephone at the time of interpretation on 09/13/2019 at 6:42 pm to provider Dr. Leonel Ramsay, who verbally acknowledged these results. IMPRESSION: 1. No acute intracranial hemorrhage or demarcated cortical infarction. 2. ASPECTS is 10 3. Generalized parenchymal atrophy and chronic small vessel ischemic disease. 4. Chronic left maxillary sinusitis as described Electronically Signed   By: Kellie Simmering DO   On: 09/13/2019 18:46   Transthoracic Echocardiogram   Recent Results (from the past 43800 hour(s))  ECHOCARDIOGRAM COMPLETE   Collection Time: 09/14/19 10:29 AM  Result Value   Weight 2,222.4   BP 124/45   Narrative     ECHOCARDIOGRAM REPORT       Patient Name:   MAYELI SUNDARA Date of Exam: 09/14/2019 Medical Rec #:  [id number]          Height:       62.0 in Accession #:    [id number]         Weight:       138.9 lb Date of Birth:  10-23-1941          BSA:          1.64 m Patient Age:    25 years           BP:           124/45 mmHg Patient Gender: F                  HR:           73 bpm. Exam Location:  Inpatient  Procedure: 2D Echo, Color Doppler and Cardiac Doppler  Indications:    Stroke   History:        Patient has no prior history of Echocardiogram examinations.                 Parkinson's.   Sonographer:    Raquel Sarna Senior RDCS Referring Phys: [phone] MCNEILL P [place]    1. Left ventricular ejection fraction, by visual estimation, is 60 to 65%. The left ventricle has normal function. Normal left ventricular size. There is no left ventricular hypertrophy.  2. Elevated left ventricular end-diastolic pressure.  3. Left ventricular diastolic Doppler parameters are consistent with impaired relaxation pattern of LV diastolic filling.  4. Global right ventricle has normal systolic function.The right ventricular size is normal. No increase in right ventricular  wall thickness.  5. Left atrial size was normal.  6. Right atrial size was normal.  7. Trivial pericardial effusion is present.  8. The mitral valve is normal in structure. Trace mitral valve regurgitation. No evidence of mitral stenosis.  9. The tricuspid valve is normal in structure. Tricuspid valve regurgitation was not visualized by color flow Doppler. 10. The aortic valve is normal in structure. Aortic valve regurgitation was not visualized by color flow Doppler. Mild aortic valve stenosis. 11. There is Mild calcification of the aortic valve. 12. There is Mild thickening of the aortic valve. 13. The pulmonic valve was normal in structure. Pulmonic valve regurgitation is not visualized by color flow Doppler. 14. TR signal is inadequate for assessing pulmonary artery systolic pressure. 15. The inferior vena cava is normal in size with <50% respiratory variability, suggesting right atrial pressure of 8 mmHg. 16. Small patent foramen ovale. 17. Evidence of atrial level shunting detected by color flow Doppler.  FINDINGS  Left Ventricle: Left ventricular ejection  fraction, by visual estimation, is 60 to 65%. The left ventricle has normal function. There is no left ventricular hypertrophy. Normal left ventricular size. Spectral Doppler shows Left ventricular diastolic  Doppler parameters are consistent with impaired relaxation pattern of LV diastolic filling. Elevated left ventricular end-diastolic pressure.  Right Ventricle: The right ventricular size is normal. No increase in right ventricular wall thickness. Global RV systolic function is has normal systolic function.  Left Atrium: Left atrial size was normal in size.  Right Atrium: Right atrial size was normal in size  Pericardium: Trivial pericardial effusion is present.  Mitral Valve: The mitral valve is normal in structure. No evidence of mitral valve stenosis by observation. Trace mitral valve regurgitation.  Tricuspid Valve: The  tricuspid valve is normal in structure. Tricuspid valve regurgitation was not visualized by color flow Doppler.  Aortic Valve: The aortic valve is normal in structure.. There is Mild thickening and Mild calcification of the aortic valve. Aortic valve regurgitation was not visualized by color flow Doppler. Mild aortic stenosis is present. There is Mild thickening of  the aortic valve. Mild calcification. Aortic valve mean gradient measures 9.4 mmHg. Aortic valve peak gradient measures 18.1 mmHg. Aortic valve area, by VTI measures 1.50 cm.  Pulmonic Valve: The pulmonic valve was normal in structure. Pulmonic valve regurgitation is not visualized by color flow Doppler.  Aorta: The aortic root, ascending aorta and aortic arch are all structurally normal, with no evidence of dilitation or obstruction.  Venous: The inferior vena cava is normal in size with less than 50% respiratory variability, suggesting right atrial pressure of 8 mmHg.  IAS/Shunts: Evidence of atrial level shunting detected by color flow Doppler. A small patent foramen ovale is detected. No ventricular septal defect is seen or detected. There is no evidence of an atrial septal defect.     LEFT VENTRICLE PLAX 2D LVIDd:         4.50 cm  Diastology LVIDs:         2.50 cm  LV e' lateral:   0.09 cm/s LV PW:         0.90 cm  LV E/e' lateral: 11.5 LV IVS:        0.70 cm  LV e' medial:    0.06 cm/s LVOT diam:     2.00 cm  LV E/e' medial:  18.6 LV SV:         70 ml LV SV Index:   41.90 LVOT Area:     3.14 cm    RIGHT VENTRICLE RV S prime:     11.60 cm/s TAPSE (M-mode): 2.3 cm  LEFT ATRIUM             Index       RIGHT ATRIUM           Index LA diam:        2.70 cm 1.65 cm/m  RA Area:     13.50 cm LA Vol (A2C):   43.3 ml 26.45 ml/m RA Volume:   34.70 ml  21.19 ml/m LA Vol (A4C):   42.6 ml 26.02 ml/m LA Biplane Vol: 46.5 ml 28.40 ml/m  AORTIC VALVE AV Area (Vmax):    1.59 cm AV Area (Vmean):   1.65 cm AV Area (VTI):      1.50 cm AV Vmax:           213.00 cm/s AV Vmean:          141.500 cm/s AV VTI:  0.478 m AV Peak Grad:      18.1 mmHg AV Mean Grad:      9.4 mmHg LVOT Vmax:         108.00 cm/s LVOT Vmean:        74.100 cm/s LVOT VTI:          0.228 m LVOT/AV VTI ratio: 0.48   AORTA Ao Root diam: 2.90 cm Ao Asc diam:  3.10 cm  MITRAL VALVE MV Area (PHT): 4.89 cm              SHUNTS MV PHT:        44.95 msec            Systemic VTI:  0.23 m MV Decel Time: 155 msec              Systemic Diam: 2.00 cm MV E velocity: 1.03 cm/s   103 cm/s MV A velocity: 110.00 cm/s 70.3 cm/s MV E/A ratio:  0.01        1.5    Skeet Latch MD Electronically signed by Skeet Latch MD Signature Date/Time: 09/14/2019/1:18:05 PM       Final     *Note: Due to a large number of results and/or encounters for the requested time period, some results have not been displayed. A complete set of results can be found in Results Review.   ECG - SR rate 60 BPM. (See cardiology reading for complete details)  EEG This study is suggestive of cortical dysfunction in the right hemisphere, nonspecific to etiology.  Additionally there is evidence of mild diffuse encephalopathy. No seizures were seen throughout the recording.    Of note, sharp transients were seen in the left anterior temporal region.  Consider prolonged study including sleep if concern for interictal activity remains  PHYSICAL EXAM Blood pressure (!) 108/58, pulse 64, temperature 98.5 F (36.9 C), temperature source Oral, resp. rate (!) 25, weight 63 kg, SpO2 93 %.  GEN: moderate distress, agitated with exam. Appears well-developed and well-nourished.  Psych: due to lack of attention/cooperation at this time, unable to assess. Eyes: No scleral injection HENT: No OP obstrucion Head: Normocephalic.  Cardiovascular: Normal rate and regular rhythm.  Respiratory: Effort normal and breath sounds normal to anterior ascultation GI: Soft.  No  distension. There is no tenderness.  Skin: warm, dry, intact.  Neuro: Mental Status: Patient is awake, alert. She poorly attends, can't follow commands. Only one word at a time spoken, she is unable to converse or comprehend at this time. She has severe left side neglect.  Cranial Nerves: II: Left hemianopia to blink, unable to count in VFs. pupils are equal, round, and reactive to light.  III,IV, VI: She has a right gaze preference, unable to track to left V: Facial sensation is symmetric to temperature VII: Facial movement with mild left facial weakness VIII: hearing is intact to voice X: Uvula elevates symmetrically XI: Shoulder shrug is symmetric. XII: tongue is midline without atrophy or fasciculations.  Motor:  5/5 strength was present in all four extremities  Sensory: Sensation seems symmetric to light touch; difficult d/t lack of cooperation. Cerebellar: unable  HOME MEDICATIONS:  Medications Prior to Admission  Medication Sig Dispense Refill  . Carbidopa-Levodopa ER (SINEMET CR) 25-100 MG tablet controlled release Take one tablet at 8 am / noon / 4pm / bedtime. (Patient taking differently: Take 1 tablet by mouth See admin instructions. Take 1 tablet by mouth at 8 AM, 1 tablet at 12 noon, and 1  tablet at 4 PM) 120 tablet 1  . Cholecalciferol (VITAMIN D-3 PO) Take 1 capsule by mouth at bedtime.    . clonazePAM (KLONOPIN) 0.5 MG tablet TAKE 1/2 (ONE-HALF) TABLET BY MOUTH AT BEDTIME (Patient taking differently: Take 0.25 mg by mouth at bedtime. ) 45 tablet 0  . losartan (COZAAR) 100 MG tablet Take 100 mg by mouth at bedtime.     . pravastatin (PRAVACHOL) 40 MG tablet Take 40 mg by mouth at bedtime.         HOSPITAL MEDICATIONS:  .  stroke: mapping our early stages of recovery book   Does not apply Once  . Carbidopa-Levodopa ER  1 tablet Oral TID WC & HS  . Chlorhexidine Gluconate Cloth  6 each Topical Daily  . mouth rinse  15 mL Mouth Rinse BID  . pantoprazole (PROTONIX)  IV  40 mg Intravenous QHS  . sodium chloride flush  3 mL Intravenous Once    ALLERGIES No Known Allergies  ASSESSMENT/PLAN Ms. Cletis Ingber is a 78 y.o. female with history of Carotid artery disease, HLD, HTN, depression, PD, dementia, osteoporosis, vit D deficiency. On 10/27 she presented with confusion and receptive aphasia, hemianopia . She received IV t-PA due, but there was no LVO for IA tx  Stroke: wk up underway. DDX seizures  Resultant  Aphasia, confusion, hemianopia  Code Stroke CT Head -    ASPECTS 8  CT head - neg  MRI head- pending for post tPA f/u imagiang  MRA head - not done  CTA H&N - no LVO.   CT Perfusion  Carotid Doppler - not needed  2D Echo - 60%EF, small PFO, no thrombus  [employer] Virus 2  neg  LDL - 86    Component Value Date/Time   LDLCALC 86 09/14/2019 0820     HgbA1c - 5.6  UDS not done  VTE prophylaxis - SCDs till 24h post tPA, then start Lovenox Diet  Diet Order            DIET DYS 3 Room service appropriate? Yes; Fluid consistency: Thin  Diet effective now               none prior to admission, now on s/p IV tPA at this time  Patient unable to be counseled to be compliant with her antithrombotic medications  Ongoing aggressive stroke risk factor management  Therapy recommendations:  pending  Disposition:  Pending  Hypertension  Home BP meds: Cozaar  Current BP meds: none  Stable . Permissive hypertension to SBP 180; but gradually normalize in 5-7 days  . Long-term BP goal normotensive  Hyperlipidemia  Home Lipid lowering medication: Pravachol 40mg   LDL 86, goal < 100  Current lipid lowering medication: Since she is under goal and given age, will not change her home dose statin at this time.   Continue statin at discharge  Diabetes  Home diabetic meds: none  Current diabetic meds:none  HgbA1c 5.6, goal < 7.0 Recent Labs    09/13/19 1814  GLUCAP 94     Other Stroke Risk  Factors  Advanced age  Other Active Problems  Parkinson's Disease- con't home Sinemet  Dementia- risk for seizures and delirium. MOCA was 18. 6 mo ago by Dr Tat, her out pt neurologist.  Stat EEG findings are concerning for seizures given the left temporal sharps, will start on Keppra 1000mg  IV BID till PO is safe  Low threshold to do cEEG if she doesn't improve to better evaluate seizrues  Hospital day # 1  Desiree Metzger-Cihelka, ARNP-C, ANVP-BC Pager: [phone] I have personally obtained history,examined this patient, reviewed notes, independently viewed imaging studies, participated in medical decision making and plan of care.ROS completed by me personally and pertinent positives fully documented  I have made any additions or clarifications directly to the above note.  She presented with sudden onset of confusion with right gaze preference and left-sided neglect possibly from right MCA parietal infarct.  Unwitnessed seizure with postictal confusion is also a possibility given her baseline dementia.  Recommend close neurological monitoring and strict blood pressure control as per post TPA protocol.  Check EEG later today as well as repeat brain imaging.  Long discussion with the patient and husband at the bedside and answered questions. This patient is critically ill and at significant risk of neurological worsening, death and care requires constant monitoring of vital signs, hemodynamics,respiratory and cardiac monitoring, extensive review of multiple databases, frequent neurological assessment, discussion with family, other specialists and medical decision making of high complexity.I have made any additions or clarifications directly to the above note.This critical care time does not reflect procedure time, or teaching time or supervisory time of PA/NP/Med Resident etc but could involve care discussion time.  I spent 30 minutes of neurocritical care time  in the care of  this  patient.      Antony Contras, MD Medical Director [facility] Stroke Center Pager: [phone] 09/14/2019 5:16 PM  To contact Stroke Continuity provider, please refer to [email or web address] After hours, contact General Neurology

## 2019-09-14 NOTE — Evaluation (Signed)
Physical Therapy Evaluation Patient Details Name: Belinda Davis MRN: [id number] DOB: 05-18-1941 Today's Date: 09/14/2019   History of Present Illness  pt is a 78 y/o female admitted with signs of stroke including speech problems, confusion and suspected hemianopsia.  Code stroke activated by EMS, TpA given though CT showed no acute changes.  MRI pending. NIH 10   PMH: Parkinson's, osteoporosis, memory loss, carotic narrowing.  Clinical Impression  .Pt admitted with/for s/s of stroke.  Mobility limited mostly by inattention and visual deficits, needing min to min guard at worst..  Pt currently limited functionally due to the problems listed below.  (see problems list.)  Pt will benefit from PT to maximize function and safety to be able to get home safely with available assist.     Follow Up Recommendations Home health PT;Supervision/Assistance - 24 hour    Equipment Recommendations  None recommended by PT;Other (comment)(TBA)    Recommendations for Other Services       Precautions / Restrictions Precautions Precautions: Fall      Mobility  Bed Mobility Overal bed mobility: Modified Independent                Transfers Overall transfer level: Needs assistance   Transfers: Sit to/from Stand Sit to Stand: Supervision         General transfer comment: pt able to power up but requires tactile and verbal cues to initiate movement - slow response to commands  Ambulation/Gait Ambulation/Gait assistance: Min guard(occ min direction assist) Gait Distance (Feet): 150 Feet Assistive device: None Gait Pattern/deviations: Step-through pattern Gait velocity: slower   General Gait Details: pt is generally steady propelling forward, but with a rotated trunk as if ready to turn right.  Pt not attending to the left unless directed phsically or with the iv pole to look and move left.  Stairs            Wheelchair Mobility    Modified Rankin (Stroke Patients  Only) Modified Rankin (Stroke Patients Only) Pre-Morbid Rankin Score: No symptoms Modified Rankin: Moderate disability     Balance Overall balance assessment: Needs assistance Sitting-balance support: No upper extremity supported;Feet supported Sitting balance-Leahy Scale: Good     Standing balance support: No upper extremity supported;During functional activity Standing balance-Leahy Scale: Fair                               Pertinent Vitals/Pain Pain Assessment: No/denies pain    Home Living Family/patient expects to be discharged to:: Private residence Living Arrangements: Spouse/significant other Available Help at Discharge: Family;Available 24 hours/day Type of Home: House Home Access: Stairs to enter Entrance Stairs-Rails: None Entrance Stairs-Number of Steps: 2 Home Layout: One level Home Equipment: None Additional Comments: spouse name is Medical sales representative     Prior Function Level of Independence: Independent               Hand Dominance   Dominant Hand: Left    Extremity/Trunk Assessment   Upper Extremity Assessment Upper Extremity Assessment: Overall WFL for tasks assessed    Lower Extremity Assessment Lower Extremity Assessment: Overall WFL for tasks assessed    Cervical / Trunk Assessment Cervical / Trunk Assessment: Normal  Communication   Communication: Other (comment)(delayed responses)  Cognition Arousal/Alertness: Awake/alert Behavior During Therapy: Flat affect Overall Cognitive Status: Impaired/Different from baseline Area of Impairment: Orientation;Attention;Memory;Following commands;Safety/judgement;Awareness;Problem solving  Orientation Level: Disoriented to;Place;Time;Situation Current Attention Level: Sustained Memory: Decreased recall of precautions;Decreased short-term memory Following Commands: Follows one step commands inconsistently;Follows one step commands with increased time Safety/Judgement:  Decreased awareness of safety;Decreased awareness of deficits Awareness: Intellectual Problem Solving: Slow processing;Decreased initiation;Difficulty sequencing General Comments: pt attempting to don sock on L foot without any sock. pt makes a face to indicate confusion that she is unable to complete task but has no awareness to the lack of sock       General Comments General comments (skin integrity, edema, etc.): vss    Exercises     Assessment/Plan    PT Assessment Patient needs continued PT services  PT Problem List Decreased balance;Decreased safety awareness;Other (comment)(inattension L and L hemianopsia)       PT Treatment Interventions Gait training;Stair training;Functional mobility training;Therapeutic activities;Balance training;Patient/family education    PT Goals (Current goals can be found in the Care Plan section)  Acute Rehab PT Goals Patient Stated Goal: none stated PT Goal Formulation: With patient Time For Goal Achievement: 09/28/19 Potential to Achieve Goals: Good    Frequency Min 3X/week   Barriers to discharge        Co-evaluation               AM-PAC PT "6 Clicks" Mobility  Outcome Measure Help needed turning from your back to your side while in a flat bed without using bedrails?: None Help needed moving from lying on your back to sitting on the side of a flat bed without using bedrails?: None Help needed moving to and from a bed to a chair (including a wheelchair)?: A Little Help needed standing up from a chair using your arms (e.g., wheelchair or bedside chair)?: A Little Help needed to walk in hospital room?: A Little Help needed climbing 3-5 steps with a railing? : A Little 6 Click Score: 20    End of Session   Activity Tolerance: Patient tolerated treatment well Patient left: in chair;with call bell/phone within reach;with family/visitor present;with chair alarm set Nurse Communication: Mobility status PT Visit Diagnosis:  Unsteadiness on feet (R26.81);Other abnormalities of gait and mobility (R26.89);Difficulty in walking, not elsewhere classified (R26.2);Other symptoms and signs involving the nervous system (R29.898)    Time: [id number] PT Time Calculation (min) (ACUTE ONLY): 30 min   Charges:   PT Evaluation $PT Eval Low Complexity: 1 Low          09/14/2019  Donnella Sham, PT Acute Rehabilitation Services [phone]  (pager) [phone]  (office)  Tessie Fass Zakariyya Helfman 09/14/2019, 6:27 PM

## 2019-09-14 NOTE — Progress Notes (Signed)
EEG complete - results pending 

## 2019-09-14 NOTE — Progress Notes (Signed)
Pt unable to stay in head coil, RN Vinnie Level is aware, sent pt for post TPA CT, will check back tomorrow with neurology to see if they still need MRI.Marland KitchenMarland Kitchen

## 2019-09-14 NOTE — Procedures (Addendum)
Patient Name: Belinda Davis  MRN: [id number]  Epilepsy Attending: Lora Havens  Referring Physician/Provider: Dr. Antony Contras Date: 09/14/2019 Duration: 24.44 minutes  Patient history: 81-year female who presented as a stroke alert and confusion.  EEG to evaluate for seizure.  Level of alertness: Awake  AEDs during EEG study: None  Technical aspects: This EEG study was done with scalp electrodes positioned according to the 10-20 International system of electrode placement. Electrical activity was acquired at a sampling rate of 500Hz  and reviewed with a high frequency filter of 70Hz  and a low frequency filter of 1Hz . EEG data were recorded continuously and digitally stored.   Description: The posterior dominant rhythm consists of 8-9 Hz activity of moderate voltage (25-35 uV) seen predominantly in posterior head regions, symmetric and reactive to eye opening and eye closing.EEG showed continuous generalized and lateralized right hemisphere 3 to 5 Hz delta slowing. Sharp transients were seen in left anterior temporal region,maximal F7/ T3.  Hyperventilation and photic stimulation were not performed.  Abnormality -Continuous slow, generalized and lateralized right hemisphere  IMPRESSION: This study is suggestive of cortical dysfunction in the right hemisphere, nonspecific to etiology.  Additionally there is evidence of mild diffuse encephalopathy. No seizures were seen throughout the recording.    Of note, sharp transients were seen in the left anterior temporal region which are favored to be normal variants.  Consider prolonged study including sleep if concern for interictal activity remains.

## 2019-09-14 NOTE — Progress Notes (Signed)
Echocardiogram 2D Echocardiogram has been performed.  Oneal Deputy Jadan Rouillard 09/14/2019, 10:29 AM

## 2019-09-14 NOTE — Telephone Encounter (Signed)
Pt currently admitted to the hospital 

## 2019-09-14 NOTE — Telephone Encounter (Signed)
Husband left msg with after hours about wanting to speak with Dr. Carles Collet about medication. She is experiencing some confusion. Patient unable to answer the year, her name or husbands name. On call instructed patient to go to the ED. Husband confirmed EMS was called.

## 2019-09-14 NOTE — Evaluation (Signed)
Clinical/Bedside Swallow Evaluation Patient Details  Name: Belinda Davis MRN: [id number] Date of Birth: [birth year]  Today's Date: 09/14/2019 Time: SLP Start Time (ACUTE ONLY): 0901 SLP Stop Time (ACUTE ONLY): 0917 SLP Time Calculation (min) (ACUTE ONLY): 16 min  Past Medical History:  Past Medical History:  Diagnosis Date  . Carotid artery narrowing   . Depression   . Dyslipidemia   . Essential hypertension   . Malaise and fatigue   . Memory loss   . Osteoporosis   . Tremor   . Vitamin D deficiency    Past Surgical History:  Past Surgical History:  Procedure Laterality Date  . ARTHROSCOPY KNEE W/ DRILLING     HPI:  Belinda Davis is a 78 y.o. F with Hx of Parkinson's, memory loss, malaise, HPT. and osteoporosis admitted for confusion and hemianopia noticed by husband on 10/27. Code stroke head CT showed no acute intracranial hemorrhage with generalized parenchymal atrophy and chronic small vessel ischemic disease. Recieved tPA 10/27. MRI and CT scheduled today. Neuro reported: pt awake, alert, oriented to person, place, month, year, and situation. Patient is able to give a clear and coherent history. She does not attend to the left side as well as the right, incorrectly identified stimuli localization. Diet currently NPO.    Assessment / Plan / Recommendation Clinical Impression  Pt observed with thin (cup, straw), puree, and regular consistencies with proper bolus mantipulation, timely swallow, and no s/sx of aspiration. Mild lingual and palatal residue after solid which was successfully washed with thin. Pts main barriers at this time are impairments in sustained attention and memory, increasing her risk of holding if not frequently cued to attend to meal. Followed appx 30% of commands during OME which was notable for mild L facial droop, palatal torus, generalized lingual weakness, and volitional labial and lingual tremors. Pt requires full assist for feeding with cues to  open mouth wide enough to accept POs. Pt unable to provide information on diet prior to admission, suspect pt similar to baseline given hx of dementia. Upgrade pt to Dys 3 with thin liquids (cup or straw) and meds crushed in puree with full assist with cueing to encourage bolus acceptance, attention to meal, and clearance of any pocketing/residue by following solids with liquids when needed. SLP Visit Diagnosis: Dysphagia, unspecified (R13.10)    Aspiration Risk  Mild aspiration risk    Diet Recommendation Dysphagia 3 (Mech soft);Thin liquid   Liquid Administration via: Straw;Cup Medication Administration: Crushed with puree Supervision: Full supervision/cueing for compensatory strategies;Staff to assist with self feeding Compensations: Lingual sweep for clearance of pocketing;Minimize environmental distractions;Follow solids with liquid    Other  Recommendations Oral Care Recommendations: Staff/trained caregiver to provide oral care;Oral care BID   Follow up Recommendations 24 hour supervision/assistance      Frequency and Duration min 1 x/week  2 weeks       Prognosis Prognosis for Safe Diet Advancement: Fair Barriers to Reach Goals: Cognitive deficits      Swallow Study   General Date of Onset: 09/13/19 HPI: Belinda Davis is a 78 y.o. F with Hx of Parkinson's, memory loss, malaise, HPT. and osteoporosis admitted for confusion and hemianopia noticed by husband on 10/27. Code stroke head CT showed no acute intracranial hemorrhage with generalized parenchymal atrophy and chronic small vessel ischemic disease. Recieved tPA 10/27. MRI and CT scheduled today. Neuro reported: pt awake, alert, oriented to person, place, month, year, and situation. Patient is able to give a clear and coherent history. She  does not attend to the left side as well as the right, incorrectly identified stimuli localization. Diet currently NPO.  Type of Study: Bedside Swallow Evaluation Diet Prior to this  Study: NPO Temperature Spikes Noted: No Respiratory Status: Room air History of Recent Intubation: No Behavior/Cognition: Distractible;Confused;Cooperative;Requires cueing;Alert Oral Cavity Assessment: Dry Oral Care Completed by SLP: Yes Oral Cavity - Dentition: Adequate natural dentition Self-Feeding Abilities: Total assist Patient Positioning: Upright in bed Baseline Vocal Quality: Normal    Oral/Motor/Sensory Function Overall Oral Motor/Sensory Function: Mild impairment Facial ROM: Reduced left Facial Symmetry: Abnormal symmetry left Facial Strength: Other (Comment)(Generalized weakness) Facial Sensation: Other (Comment)(Unable to assess- no response to questions) Lingual ROM: Other (Comment)(Tremor, reduced range) Lingual Symmetry: Within Functional Limits Lingual Strength: Other (Comment)(UTA) Lingual Sensation: Other (Comment)(UTA) Velum: Within Functional Limits Mandible: Within Functional Limits   Ice Chips Ice chips: Not tested   Thin Liquid Thin Liquid: Within functional limits Presentation: Cup;Straw    Nectar Thick Nectar Thick Liquid: Not tested   Honey Thick Honey Thick Liquid: Not tested   Puree Puree: Within functional limits Presentation: Spoon   Solid     Solid: Impaired Oral Phase Functional Implications: Oral residue(lingual residue)      Shawon Denzer 09/14/2019,12:22 PM

## 2019-09-15 ENCOUNTER — Inpatient Hospital Stay (HOSPITAL_COMMUNITY): Payer: PPO

## 2019-09-15 DIAGNOSIS — E785 Hyperlipidemia, unspecified: Secondary | ICD-10-CM | POA: Diagnosis present

## 2019-09-15 DIAGNOSIS — I6300 Cerebral infarction due to thrombosis of unspecified precerebral artery: Secondary | ICD-10-CM

## 2019-09-15 DIAGNOSIS — I10 Essential (primary) hypertension: Secondary | ICD-10-CM | POA: Diagnosis present

## 2019-09-15 DIAGNOSIS — R569 Unspecified convulsions: Secondary | ICD-10-CM

## 2019-09-15 DIAGNOSIS — F0390 Unspecified dementia without behavioral disturbance: Secondary | ICD-10-CM | POA: Diagnosis present

## 2019-09-15 MED ORDER — ASPIRIN 81 MG PO TBEC: 81.0000 mg | DELAYED_RELEASE_TABLET | Freq: Every day | ORAL | Status: AC

## 2019-09-15 MED ORDER — LEVETIRACETAM 500 MG PO TABS
1000.0000 mg | ORAL_TABLET | Freq: Two times a day (BID) | ORAL | Status: DC
  Administered 2019-09-15: 14:00:00 1000 mg via ORAL
  Filled 2019-09-15: qty 2

## 2019-09-15 MED ORDER — LEVETIRACETAM 1000 MG PO TABS: 1000.0000 mg | ORAL_TABLET | Freq: Two times a day (BID) | ORAL | 2 refills | Status: DC

## 2019-09-15 MED ORDER — ASPIRIN EC 81 MG PO TBEC: 81.0000 mg | DELAYED_RELEASE_TABLET | Freq: Every day | ORAL | Status: DC

## 2019-09-15 NOTE — Progress Notes (Addendum)
Occupational Therapy Treatment Patient Details Name: Belinda Davis MRN: [id number] DOB: 31-Jan-1941 Today's Date: 09/15/2019    History of present illness pt is a 78 y/o female admitted with signs of stroke including speech problems, confusion and suspected hemianopsia.  Code stroke activated by EMS, TpA given though CT showed no acute changes.  MRI pending. NIH 10   PMH: Parkinson's, osteoporosis, memory loss, carotic narrowing.   OT comments  Pt making great progress towards OT goals this session. Session focus on L visual inattention through ADL participation. Pt Supervision for standing ADL at sink with no AD. Pt required no cues to locate needed ADL items in in L visual field. Pt slightly disoriented upon entering bathroom requiring 1 verbal cue to orient pt to sink, but pt able to self correct with supervision. Pt min guard for functional mobility with no AD. Updated DC rec as pt making great progress towards OT goals. Recommend 3n1 for home to decrease fall risk and assist with safe transfers; will let OTR know about change in POC. Pt likely to DC home today with no OT f/u. Will continue to follow acutely per POC.    Follow Up Recommendations  No OT follow up    Equipment Recommendations  3 in 1 bedside commode    Recommendations for Other Services      Precautions / Restrictions Precautions Precautions: Fall       Mobility Bed Mobility Overal bed mobility: Modified Independent                Transfers Overall transfer level: Needs assistance   Transfers: Sit to/from Stand;Stand Pivot Transfers Sit to Stand: Supervision Stand pivot transfers: Min guard       General transfer comment: supervision for safety; MIN guard for stand pivot to recliner d/t line management    Balance Overall balance assessment: Needs assistance Sitting-balance support: No upper extremity supported;Feet supported Sitting balance-Leahy Scale: Good     Standing balance support:  During functional activity Standing balance-Leahy Scale: Fair                             ADL either performed or assessed with clinical judgement   ADL Overall ADL's : Needs assistance/impaired     Grooming: Supervision/safety;Standing;Wash/dry face;Wash/dry hands Grooming Details (indicate cue type and reason): pt requires 1 cue to located sink when entering the bathroom as pt disoriented to new environment;pt required no cues to locate items on L side; supervision for safety while standing at sink         Upper Body Dressing : Minimal assistance;Standing Upper Body Dressing Details (indicate cue type and reason): hospital gown     Toilet Transfer: Min guard;Ambulation           Functional mobility during ADLs: Min guard General ADL Comments: session focus on standing grooming at sink; pt required no cues to locate ADL items on L side; supervision for standing balance; L inattention seems to have improved     Vision Baseline Vision/History: Wears glasses;Cataracts Additional Comments: L visual inattention seems to have improved   Perception     Praxis      Cognition Arousal/Alertness: Awake/alert Behavior During Therapy: WFL for tasks assessed/performed Overall Cognitive Status: Within Functional Limits for tasks assessed                                 General  Comments: pt WFL this session AxOx3; disoriented to day of week initally but able to self correct when cued, slightly slow to initiate and process but overall much better; husband reports a big change from yesterday        Exercises     Shoulder Instructions       General Comments husband present throughout session, supportive and encouraging    Pertinent Vitals/ Pain       Pain Assessment: No/denies pain  Home Living                                          Prior Functioning/Environment              Frequency  Min 2X/week        Progress  Toward Goals  OT Goals(current goals can now be found in the care plan section)  Progress towards OT goals: Progressing toward goals  Acute Rehab OT Goals Patient Stated Goal: none stated OT Goal Formulation: With patient/family Time For Goal Achievement: 09/28/19 Potential to Achieve Goals: Good  Plan Discharge plan needs to be updated    Co-evaluation                 AM-PAC OT "6 Clicks" Daily Activity     Outcome Measure   Help from another person eating meals?: A Little Help from another person taking care of personal grooming?: A Little Help from another person toileting, which includes using toliet, bedpan, or urinal?: A Little Help from another person bathing (including washing, rinsing, drying)?: A Little Help from another person to put on and taking off regular upper body clothing?: None Help from another person to put on and taking off regular lower body clothing?: A Little 6 Click Score: 19    End of Session    OT Visit Diagnosis: Unsteadiness on feet (R26.81);Other symptoms and signs involving cognitive function   Activity Tolerance Patient tolerated treatment well   Patient Left in bed;with call bell/phone within reach;Other (comment)(handed off to PT)   Nurse Communication          Time: [id number] OT Time Calculation (min): 30 min  Charges: OT General Charges $OT Visit: 1 Visit OT Treatments $Self Care/Home Management : 23-37 mins  [place], [place] [facility] [phone] [place] 09/15/2019, 2:01 PM

## 2019-09-15 NOTE — Progress Notes (Signed)
SLP Cancellation Note  Patient Details Name: Belinda Davis MRN: [id number] DOB: [birth year]   Cancelled treatment:        Patient gone for MRI (11:47). Will check back at next available time.    Charlynne Cousins Artie Takayama, MA, CCC-SLP 09/15/2019 2:08 PM

## 2019-09-15 NOTE — Procedures (Signed)
Patient Name: Belinda Davis  MRN: [id number]  Epilepsy Attending: Lora Havens  Referring Physician/Provider: Dr. Antony Contras Duration: 09/14/2019 1805 to 09/15/2019 1025  Patient history: 33-year female who presented as a stroke alert and confusion.  EEG to evaluate for seizure.  Level of alertness: Awake, asleep  AEDs during EEG study: None  Technical aspects: This EEG study was done with scalp electrodes positioned according to the 10-20 International system of electrode placement. Electrical activity was acquired at a sampling rate of 500Hz  and reviewed with a high frequency filter of 70Hz  and a low frequency filter of 1Hz . EEG data were recorded continuously and digitally stored.   Description: The posterior dominant rhythm consists of 8-9 Hz activity of moderate voltage (25-35 uV) seen predominantly in posterior head regions, symmetric and reactive to eye opening and eye closing. Sleep was characterized by vertex waves, sleep spindles ( 12-14Hz ), maximal frontocentral. EEG showed intermittent generalized and lateralized right hemisphere 3 to 5 Hz delta slowing.  Hyperventilation and photic stimulation were not performed.  Abnormality -Intermittent slow, generalized and lateralized right hemisphere  IMPRESSION: This study is suggestive of cortical dysfunction in the right hemisphere, nonspecific to etiology.  Additionally there is evidence of mild diffuse encephalopathy. No seizures were seen throughout the recording.    Belinda Davis

## 2019-09-15 NOTE — Discharge Instructions (Signed)
Seizure, Adult A seizure is a sudden burst of abnormal electrical activity in the brain. Seizures usually last from 30 seconds to 2 minutes. They can cause many different symptoms. Usually, seizures are not harmful unless they last a long time. What are the causes? Common causes of this condition include:  Fever or infection.  Conditions that affect the brain, such as: ? A brain abnormality that you were born with. ? A brain or head injury. ? Bleeding in the brain. ? A tumor. ? Stroke. ? Brain disorders such as autism or cerebral palsy.  Low blood sugar.  Conditions that are passed from parent to child (are inherited).  Problems with substances, such as: ? Having a reaction to a drug or a medicine. ? Suddenly stopping the use of a substance (withdrawal). In some cases, the cause may not be known. A person who has repeated seizures over time without a clear cause has a condition called epilepsy. What increases the risk? You are more likely to get this condition if you have:  A family history of epilepsy.  Had a seizure in the past.  A brain disorder.  A history of head injury, lack of oxygen at birth, or strokes. What are the signs or symptoms? There are many types of seizures. The symptoms vary depending on the type of seizure you have. Examples of symptoms during a seizure include:  Shaking (convulsions).  Stiffness in the body.  Passing out (losing consciousness).  Head nodding.  Staring.  Not responding to sound or touch.  Loss of bladder control and bowel control. Some people have symptoms right before and right after a seizure happens. Symptoms before a seizure may include:  Fear.  Worry (anxiety).  Feeling like you may vomit (nauseous).  Feeling like the room is spinning (vertigo).  Feeling like you saw or heard something before (dj vu).  Odd tastes or smells.  Changes in how you see. You may see flashing lights or spots. Symptoms after a  seizure happens can include:  Confusion.  Sleepiness.  Headache.  Weakness on one side of the body. How is this treated? Most seizures will stop on their own in under 5 minutes. In these cases, no treatment is needed. Seizures that last longer than 5 minutes will usually need treatment. Treatment can include:  Medicines given through an IV tube.  Avoiding things that are known to cause your seizures. These can include medicines that you take for another condition.  Medicines to treat epilepsy.  Surgery to stop the seizures. This may be needed if medicines do not help. Follow these instructions at home: Medicines  Take over-the-counter and prescription medicines only as told by your doctor.  Do not eat or drink anything that may keep your medicine from working, such as alcohol. Activity  Do not do any activities that would be dangerous if you had another seizure, like driving or swimming. Wait until your doctor says it is safe for you to do them.  If you live in the [place] ask your local DMV (department of motor vehicles) when you can drive.  Get plenty of rest. Teaching others Teach friends and family what to do when you have a seizure. They should:  Lay you on the ground.  Protect your head and body.  Loosen any tight clothing around your neck.  Turn you on your side.  Not hold you down.  Not put anything into your mouth.  Know whether or not you need emergency care.  Stay   with you until you are better.  General instructions  Contact your doctor each time you have a seizure.  Avoid anything that gives you seizures.  Keep a seizure diary. Write down: ? What you think caused each seizure. ? What you remember about each seizure.  Keep all follow-up visits as told by your doctor. This is important. Contact a doctor if:  You have another seizure.  You have seizures more often.  There is any change in what happens during your seizures.  You keep having  seizures with treatment.  You have symptoms of being sick or having an infection. Get help right away if:  You have a seizure that: ? Lasts longer than 5 minutes. ? Is different than seizures you had before. ? Makes it harder to breathe. ? Happens after you hurt your head.  You have any of these symptoms after a seizure: ? Not being able to speak. ? Not being able to use a part of your body. ? Confusion. ? A bad headache.  You have two or more seizures in a row.  You do not wake up right after a seizure.  You get hurt during a seizure. These symptoms may be an emergency. Do not wait to see if the symptoms will go away. Get medical help right away. Call your local emergency services (911 in the [place] Do not drive yourself to the hospital. Summary  Seizures usually last from 30 seconds to 2 minutes. Usually, they are not harmful unless they last a long time.  Do not eat or drink anything that may keep your medicine from working, such as alcohol.  Teach friends and family what to do when you have a seizure.  Contact your doctor each time you have a seizure. This information is not intended to replace advice given to you by your health care provider. Make sure you discuss any questions you have with your health care provider. Document Released: 04/21/2008 Document Revised: 01/21/2019 Document Reviewed: 01/21/2019 Else[employer] Patient Education  2020 [employer]  

## 2019-09-15 NOTE — Progress Notes (Signed)
LTM EEG discontinued - no skin breakdown at unhook.   

## 2019-09-15 NOTE — Progress Notes (Signed)
Physical Therapy Treatment Patient Details Name: Belinda Davis MRN: [id number] DOB: Dec 16, 1940 Today's Date: 09/15/2019    History of Present Illness pt is a 78 y/o female admitted with signs of stroke including speech problems, confusion and suspected hemianopsia.  Code stroke activated by EMS, TpA given though CT showed no acute changes.  MRI pending. NIH 10   PMH: Parkinson's, osteoporosis, memory loss, carotic narrowing.    PT Comments    Amazingly improved.  Likely close to baseline.  Should be great to d/c today.  Now has no PT needs.  Signing off.  Goals met.    Follow Up Recommendations  No PT follow up     Equipment Recommendations  None recommended by PT    Recommendations for Other Services       Precautions / Restrictions Precautions Precautions: Fall    Mobility  Bed Mobility Overal bed mobility: Modified Independent                Transfers Overall transfer level: Needs assistance   Transfers: Sit to/from Stand;Stand Pivot Transfers Sit to Stand: Supervision Stand pivot transfers: Min guard       General transfer comment: supervision for safety; MIN guard for stand pivot to recliner d/t line management  Ambulation/Gait Ambulation/Gait assistance: Supervision;Modified independent (Device/Increase time) Gait Distance (Feet): 350 Feet Assistive device: None Gait Pattern/deviations: Step-through pattern Gait velocity: age appropriate   General Gait Details: steady, gait speed age appropriate, carrying 2 monitors, scanning without deviation.  No noticeable L inattension   Stairs Stairs: Yes Stairs assistance: Supervision Stair Management: One rail Right;Alternating pattern;Forwards Number of Stairs: 3     Wheelchair Mobility    Modified Rankin (Stroke Patients Only) Modified Rankin (Stroke Patients Only) Pre-Morbid Rankin Score: No symptoms Modified Rankin: No significant disability     Balance Overall balance assessment:  Needs assistance Sitting-balance support: No upper extremity supported;Feet supported Sitting balance-Leahy Scale: Good     Standing balance support: During functional activity Standing balance-Leahy Scale: Fair                              Cognition Arousal/Alertness: Awake/alert Behavior During Therapy: WFL for tasks assessed/performed Overall Cognitive Status: Within Functional Limits for tasks assessed                                 General Comments: pt WFL this session AxOx3; disoriented to day of week initally but able to self correct when cued, slightly slow to initiate and process but overall much better; husband reports a big change from yesterday      Exercises      General Comments General comments (skin integrity, edema, etc.): husband present throughout session, supportive and encouraging      Pertinent Vitals/Pain Pain Assessment: No/denies pain    Home Living                      Prior Function            PT Goals (current goals can now be found in the care plan section) Acute Rehab PT Goals Patient Stated Goal: none stated PT Goal Formulation: With patient Time For Goal Achievement: 09/28/19 Potential to Achieve Goals: Good Progress towards PT goals: Progressing toward goals    Frequency    Min 3X/week      PT Plan Current plan remains appropriate  Co-evaluation              AM-PAC PT "6 Clicks" Mobility   Outcome Measure  Help needed turning from your back to your side while in a flat bed without using bedrails?: None Help needed moving from lying on your back to sitting on the side of a flat bed without using bedrails?: None Help needed moving to and from a bed to a chair (including a wheelchair)?: None Help needed standing up from a chair using your arms (e.g., wheelchair or bedside chair)?: None Help needed to walk in hospital room?: None Help needed climbing 3-5 steps with a railing? :  None 6 Click Score: 24    End of Session   Activity Tolerance: Patient tolerated treatment well Patient left: in bed;with call bell/phone within reach;with family/visitor present;with nursing/sitter in room Nurse Communication: Mobility status PT Visit Diagnosis: Other abnormalities of gait and mobility (R26.89)     Time: 0746-0029 PT Time Calculation (min) (ACUTE ONLY): 13 min  Charges:  $Gait Training: 8-22 mins                     09/15/2019  Donnella Sham, PT Acute Rehabilitation Services [phone]  (pager) [phone]  (office)   Tessie Fass Clydell Alberts 09/15/2019, 11:08 AM

## 2019-09-15 NOTE — Progress Notes (Signed)
Pt d/c to home by car with family. Assessment stable. D/c instructions reviewed. All questions answered 

## 2019-09-15 NOTE — Discharge Summary (Addendum)
Stroke Discharge Summary  Patient ID: Belinda Davis   MRN: [id number]      DOB: Oct 22, 1941  Date of Admission: 09/13/2019 Date of Discharge: 09/15/2019  Attending Physician:  Garvin Fila, MD, Stroke MD Consultant(s):    None  Patient's PCP:  Belinda Dandy, NP  DISCHARGE DIAGNOSIS:  Principal Problem:   Stroke like episode treated with iv TPA but negative MRI. Likely Seizures ([name]) Active Problems:   Parkinson's disease ([name])   Stroke-like episode s/p tPA   Essential hypertension   Hyperlipidemia   Dementia ([name])   Past Medical History:  Diagnosis Date  . Carotid artery narrowing   . Depression   . Dyslipidemia   . Essential hypertension   . Malaise and fatigue   . Memory loss   . Osteoporosis   . Tremor   . Vitamin D deficiency    Past Surgical History:  Procedure Laterality Date  . ARTHROSCOPY KNEE W/ DRILLING      Allergies as of 09/15/2019   No Known Allergies     Medication List    TAKE these medications   aspirin 81 MG EC tablet Take 1 tablet (81 mg total) by mouth daily. Start taking on: September 16, 2019   Carbidopa-Levodopa ER 25-100 MG tablet controlled release Commonly known as: SINEMET CR Take one tablet at 8 am / noon / 4pm / bedtime. What changed:   how much to take  how to take this  when to take this  additional instructions   clonazePAM 0.5 MG tablet Commonly known as: KLONOPIN TAKE 1/2 (ONE-HALF) TABLET BY MOUTH AT BEDTIME What changed: See the new instructions.   levETIRAcetam 1000 MG tablet Commonly known as: KEPPRA Take 1 tablet (1,000 mg total) by mouth 2 (two) times daily.   losartan 100 MG tablet Commonly known as: COZAAR Take 100 mg by mouth at bedtime.   pravastatin 40 MG tablet Commonly known as: PRAVACHOL Take 40 mg by mouth at bedtime.   VITAMIN D-3 PO Take 1 capsule by mouth at bedtime.       LABORATORY STUDIES CBC    Component Value Date/Time   WBC 6.5 09/14/2019 0820   RBC 3.03 (L)  09/14/2019 0820   HGB 9.8 (L) 09/14/2019 0820   HCT 29.2 (L) 09/14/2019 0820   PLT 162 09/14/2019 0820   MCV 96.4 09/14/2019 0820   MCH 32.3 09/14/2019 0820   MCHC 33.6 09/14/2019 0820   RDW 13.0 09/14/2019 0820   LYMPHSABS 1.3 09/13/2019 1822   MONOABS 0.7 09/13/2019 1822   EOSABS 0.1 09/13/2019 1822   BASOSABS 0.0 09/13/2019 1822   CMP    Component Value Date/Time   NA 134 (L) 09/13/2019 1822   K 4.0 09/13/2019 1822   CL 102 09/13/2019 1822   CO2 22 09/13/2019 1822   GLUCOSE 101 (H) 09/13/2019 1822   BUN 26 (H) 09/13/2019 1822   CREATININE 1.39 (H) 09/13/2019 1822   CALCIUM 8.8 (L) 09/13/2019 1822   PROT 7.3 09/13/2019 1822   ALBUMIN 3.6 09/13/2019 1822   AST 20 09/13/2019 1822   ALT 5 09/13/2019 1822   ALKPHOS 58 09/13/2019 1822   BILITOT 1.1 09/13/2019 1822   GFRNONAA 36 (L) 09/13/2019 1822   GFRAA 42 (L) 09/13/2019 1822   COAGS Lab Results  Component Value Date   INR 1.0 09/13/2019   Lipid Panel    Component Value Date/Time   CHOL 152 09/14/2019 0820   TRIG 98 09/14/2019 0820  HDL 46 09/14/2019 0820   CHOLHDL 3.3 09/14/2019 0820   VLDL 20 09/14/2019 0820   LDLCALC 86 09/14/2019 0820   HgbA1C  Lab Results  Component Value Date   HGBA1C 5.6 09/14/2019    SIGNIFICANT DIAGNOSTIC STUDIES Ct Angio Head W Or Wo Contrast  Result Date: 09/13/2019 CLINICAL DATA:  Stroke code.  Left-sided neglect. EXAM: CT ANGIOGRAPHY HEAD AND NECK TECHNIQUE: Multidetector CT imaging of the head and neck was performed using the standard protocol during bolus administration of intravenous contrast. Multiplanar CT image reconstructions and MIPs were obtained to evaluate the vascular anatomy. Carotid stenosis measurements (when applicable) are obtained utilizing NASCET criteria, using the distal internal carotid diameter as the denominator. CONTRAST:  50mL OMNIPAQUE IOHEXOL 350 MG/ML SOLN COMPARISON:  Non-contrast CT head performed earlier the same day 09/13/2011, noncontrast head CT  09/20/2018, carotid artery duplex 01/18/2018 FINDINGS: CTA NECK FINDINGS Aortic arch: Standard branching. Included portions of the aortic arch demonstrate no evidence of dissection or aneurysm. Mild calcified plaque within the proximal left subclavian artery. Right carotid system: CCA widely patent. Calcified plaque at the carotid bifurcation and within the proximal ICA with less than 50% narrowing of the proximal ICA. Distal to this, the ICA is widely patent within the neck. Left carotid system: CCA widely patent. Soft and calcified plaque within the carotid bifurcation and proximal ICA. No measurable stenosis of the proximal ICA as compared to the more distal vessel. Distal to this, the ICA is widely patent within the neck. Vertebral arteries: Codominant. The vertebral arteries are widely patent within the neck. Skeleton: No acute bony abnormality. Cervical spondylosis with multilevel posterior disc osteophytes, uncovertebral and facet hypertrophy. Congenital nonunion of the posterior arch of C1. Other neck: No soft tissue neck mass or pathologically enlarged cervical chain lymph nodes. Thyroid negative. Upper chest: No consolidation within the imaged lung apices Review of the MIP images confirms the above findings CTA HEAD FINDINGS Anterior circulation: Mild calcified plaque within the intracranial internal carotid arteries. Mild narrowing of the paraclinoid right ICA. Motion artifact somewhat limits evaluation of the M2 and more distal MCA branch vessels bilaterally. Within this limitation, the bilateral middle and anterior cerebral arteries are patent without significant proximal stenosis identified. No intracranial aneurysm identified. Posterior circulation: The intracranial internal vertebral arteries are patent without significant stenosis. The basilar artery is patent without significant stenosis. The bilateral posterior cerebral arteries are patent without significant proximal stenosis. Venous sinuses:  Within limitations of contrast timing, no convincing thrombus. Anatomic variants: None significant Review of the MIP images confirms the above findings No emergent large vessel occlusion identified. These results were called by telephone at the time of interpretation on 09/13/2019 at 6:42 p.m. to provider [facility] , who verbally acknowledged these results. IMPRESSION: CTA neck: 1. Bilateral common and internal carotid arteries patent within the neck without significant stenosis (50% or greater). Calcified plaque results in less than 50% narrowing of the proximal right ICA. Mixed plaque at the left carotid bifurcation without measurable stenosis of the proximal left ICA. 2. Bilateral vertebral arteries widely patent within the neck. CTA head: 1. Motion degradation somewhat limits evaluation of the M2 and more distal MCA branch vessels. Within this limitation, no intracranial large vessel occlusion or proximal high-grade arterial stenosis is identified. 2. Mild atherosclerotic disease of the intracranial internal carotid arteries. Electronically Signed   By: Kellie Simmering DO   On: 09/13/2019 19:20   Ct Head Wo Contrast  Result Date: 09/14/2019 CLINICAL DATA:  Stroke, follow-up  post tPA. EXAM: CT HEAD WITHOUT CONTRAST TECHNIQUE: Contiguous axial images were obtained from the base of the skull through the vertex without intravenous contrast. COMPARISON:  Non-contrast CT head and CT angiogram head/neck performed 09/13/2019 FINDINGS: Brain: No evidence of acute intracranial hemorrhage. No demarcated cortical infarction. No evidence of intracranial mass. No midline shift or extra-axial fluid collection. Ill-defined hypoattenuation within the cerebral white matter is similar to prior exam and consistent with moderate chronic small vessel ischemic disease. Stable mild generalized parenchymal atrophy. A subcentimeter somewhat ill-defined focus of hypodensity within the right pons was not definitively present on  prior exam (series 2, image 10) (series 5, image 30). Vascular: No hyperdense vessel.  Atherosclerotic calcifications. Skull: Normal. Negative for fracture or focal lesion. Sinuses/Orbits: Visualized orbits demonstrate no acute abnormality. Unchanged chronic left maxillary sinusitis. No significant mastoid effusion. IMPRESSION: 1. No acute intracranial hemorrhage or demarcated cortical infarction. 2. A subcentimeter ill-defined focus of hypodensity within the right pons was not definitively present on prior exam. This may reflect chronic small vessel ischemic disease seen to better advantage on today's study. Correlate with scheduled brain MRI to exclude acute lacunar infarct. 3. Generalized parenchymal atrophy and chronic small vessel ischemic disease. 4. Chronic left maxillary sinusitis. Electronically Signed   By: Kellie Simmering DO   On: 09/14/2019 17:50   Ct Angio Neck W Or Wo Contrast  Result Date: 09/13/2019 CLINICAL DATA:  Stroke code.  Left-sided neglect. EXAM: CT ANGIOGRAPHY HEAD AND NECK TECHNIQUE: Multidetector CT imaging of the head and neck was performed using the standard protocol during bolus administration of intravenous contrast. Multiplanar CT image reconstructions and MIPs were obtained to evaluate the vascular anatomy. Carotid stenosis measurements (when applicable) are obtained utilizing NASCET criteria, using the distal internal carotid diameter as the denominator. CONTRAST:  38mL OMNIPAQUE IOHEXOL 350 MG/ML SOLN COMPARISON:  Non-contrast CT head performed earlier the same day 09/13/2011, noncontrast head CT 09/20/2018, carotid artery duplex 01/18/2018 FINDINGS: CTA NECK FINDINGS Aortic arch: Standard branching. Included portions of the aortic arch demonstrate no evidence of dissection or aneurysm. Mild calcified plaque within the proximal left subclavian artery. Right carotid system: CCA widely patent. Calcified plaque at the carotid bifurcation and within the proximal ICA with less than  50% narrowing of the proximal ICA. Distal to this, the ICA is widely patent within the neck. Left carotid system: CCA widely patent. Soft and calcified plaque within the carotid bifurcation and proximal ICA. No measurable stenosis of the proximal ICA as compared to the more distal vessel. Distal to this, the ICA is widely patent within the neck. Vertebral arteries: Codominant. The vertebral arteries are widely patent within the neck. Skeleton: No acute bony abnormality. Cervical spondylosis with multilevel posterior disc osteophytes, uncovertebral and facet hypertrophy. Congenital nonunion of the posterior arch of C1. Other neck: No soft tissue neck mass or pathologically enlarged cervical chain lymph nodes. Thyroid negative. Upper chest: No consolidation within the imaged lung apices Review of the MIP images confirms the above findings CTA HEAD FINDINGS Anterior circulation: Mild calcified plaque within the intracranial internal carotid arteries. Mild narrowing of the paraclinoid right ICA. Motion artifact somewhat limits evaluation of the M2 and more distal MCA branch vessels bilaterally. Within this limitation, the bilateral middle and anterior cerebral arteries are patent without significant proximal stenosis identified. No intracranial aneurysm identified. Posterior circulation: The intracranial internal vertebral arteries are patent without significant stenosis. The basilar artery is patent without significant stenosis. The bilateral posterior cerebral arteries are patent without significant proximal  stenosis. Venous sinuses: Within limitations of contrast timing, no convincing thrombus. Anatomic variants: None significant Review of the MIP images confirms the above findings No emergent large vessel occlusion identified. These results were called by telephone at the time of interpretation on 09/13/2019 at 6:42 p.m. to provider [facility] , who verbally acknowledged these results. IMPRESSION: CTA neck:  1. Bilateral common and internal carotid arteries patent within the neck without significant stenosis (50% or greater). Calcified plaque results in less than 50% narrowing of the proximal right ICA. Mixed plaque at the left carotid bifurcation without measurable stenosis of the proximal left ICA. 2. Bilateral vertebral arteries widely patent within the neck. CTA head: 1. Motion degradation somewhat limits evaluation of the M2 and more distal MCA branch vessels. Within this limitation, no intracranial large vessel occlusion or proximal high-grade arterial stenosis is identified. 2. Mild atherosclerotic disease of the intracranial internal carotid arteries. Electronically Signed   By: Kellie Simmering DO   On: 09/13/2019 19:20   Mr Brain Wo Contrast  Result Date: 09/15/2019 CLINICAL DATA:  78 year old female code stroke presentation on 09/13/2019 with left side neglect. Status post IV tPA. Possible right pontine lesion on follow-up head CT yesterday. EXAM: MRI HEAD WITHOUT CONTRAST TECHNIQUE: Multiplanar, multiecho pulse sequences of the brain and surrounding structures were obtained without intravenous contrast. COMPARISON:  09/14/2019 and earlier. FINDINGS: Brain: No restricted diffusion or evidence of acute infarction. No acute intracranial hemorrhage or chronic blood products identified. No midline shift, mass effect, evidence of mass lesion, ventriculomegaly, extra-axial collection or acute intracranial hemorrhage. Cervicomedullary junction and pituitary are within normal limits. Patchy and confluent cerebral white matter T2 and FLAIR hyperintensity in both hemispheres. Chronic appearing encephalomalacia in the superior left sensory strip (series 5, image 24). No other cortical encephalomalacia identified. T2 heterogeneity throughout the deep gray nuclei appear mostly due to perivascular spaces, but some superimposed chronic small vessel ischemia is suspected. Up to moderate patchy T2 hyperintensity in the pons,  but no direct correlation to the possible right pontine CT lesion which I suspect was artifact. Signal elsewhere in the brainstem and in the cerebellum within normal limits. Vascular: Major intracranial vascular flow voids are preserved. Skull and upper cervical spine: Negative visible cervical spine. Visualized bone marrow signal is within normal limits. Sinuses/Orbits: Negative orbits. Left maxillary sinusitis with inspissated material again noted. Trace or mild sinus mucosal thickening elsewhere. Other: Mastoids are clear. Visible internal auditory structures appear normal. Scalp and face soft tissues appear negative. IMPRESSION: 1. No acute intracranial abnormality; no acute or subacute infarct identified and no acute or chronic hemorrhage. 2. Confluent cerebral white matter signal changes favored due to chronic small vessel disease given underlying small chronic cortical encephalomalacia in the left superior sensory strip. Mild-to-moderate similar signal changes in the pons. 3. Chronic left maxillary sinusitis. Electronically Signed   By: Genevie Ann M.D.   On: 09/15/2019 14:02   Ct Head Code Stroke Wo Contrast  Result Date: 09/13/2019 CLINICAL DATA:  Code stroke. Focal neuro deficit, less than 6 hours, stroke suspected. Additional history provided: Left-sided neglect. EXAM: CT HEAD WITHOUT CONTRAST TECHNIQUE: Contiguous axial images were obtained from the base of the skull through the vertex without intravenous contrast. COMPARISON:  Head CT 09/20/2018 FINDINGS: Brain: No evidence of acute intracranial hemorrhage. No demarcated cortical infarction. No evidence of intracranial mass. No midline shift or extra-axial fluid collection. Ill-defined hypoattenuation within the cerebral white matter consistent with moderate chronic small vessel ischemic disease. Stable mild generalized parenchymal atrophy.  Vascular: No definite hyperdense vessel. Atherosclerotic calcification of the carotid artery siphons. Skull:  Normal. Negative for fracture or focal lesion. Sinuses/Orbits: Visualized orbits demonstrate no acute abnormality. Chronic left maxillary sinusitis with extensive partial opacification of the left maxillary sinus and associated reactive osteitis. Hyperdensity within the left maxillary sinus may reflect inspissated secretions and/or sequela of chronic fungal sinusitis. Mild mucosal thickening within posterior right ethmoid air cell and within the right sphenoid sinus. No significant mastoid effusion ASPECTS ([place] Stroke Program Early CT Score) - Ganglionic level infarction (caudate, lentiform nuclei, internal capsule, insula, M1-M3 cortex): 7 - Supraganglionic infarction (M4-M6 cortex): 3 Total score (0-10 with 10 being normal): 10 These results were called by telephone at the time of interpretation on 09/13/2019 at 6:42 pm to provider Dr. Leonel Ramsay, who verbally acknowledged these results. IMPRESSION: 1. No acute intracranial hemorrhage or demarcated cortical infarction. 2. ASPECTS is 10 3. Generalized parenchymal atrophy and chronic small vessel ischemic disease. 4. Chronic left maxillary sinusitis as described Electronically Signed   By: Kellie Simmering DO   On: 09/13/2019 18:46    2D Echocardiogram  1. Left ventricular ejection fraction, by visual estimation, is 60 to 65%. The left ventricle has normal function. Normal left ventricular size. There is no left ventricular hypertrophy.  2. Elevated left ventricular end-diastolic pressure.  3. Left ventricular diastolic Doppler parameters are consistent with impaired relaxation pattern of LV diastolic filling.  4. Global right ventricle has normal systolic function.The right ventricular size is normal. No increase in right ventricular wall thickness.  5. Left atrial size was normal.  6. Right atrial size was normal.  7. Trivial pericardial effusion is present.  8. The mitral valve is normal in structure. Trace mitral valve regurgitation. No evidence of  mitral stenosis.  9. The tricuspid valve is normal in structure. Tricuspid valve regurgitation was not visualized by color flow Doppler. 10. The aortic valve is normal in structure. Aortic valve regurgitation was not visualized by color flow Doppler. Mild aortic valve stenosis. 11. There is Mild calcification of the aortic valve. 12. There is Mild thickening of the aortic valve. 13. The pulmonic valve was normal in structure. Pulmonic valve regurgitation is not visualized by color flow Doppler. 14. TR signal is inadequate for assessing pulmonary artery systolic pressure. 15. The inferior vena cava is normal in size with <50% respiratory variability, suggesting right atrial pressure of 8 mmHg. 16. Small patent foramen ovale. 17. Evidence of atrial level shunting detected by color flow Doppler.    ECG - SR rate 60 BPM. (See cardiology reading for complete details)  EEG This study issuggestive of cortical dysfunction in the right hemisphere, nonspecific to etiology. Additionally there is evidence of mild diffuse encephalopathy. No seizures were seen throughout the recording.Of note, sharp transients were seen in the left anterior temporal region. Consider prolonged study including sleep ifconcern for interictal activity remains  HISTORY OF PRESENT ILLNESS  Lovia Honeycuttis a 78 y.o.femalewho was in her normal state of health when her husband spoke to her around 3:30 PM on 09/13/2019. He then took a shower and when he emerged from the shower, he found her sitting at the table and acting confused. She then tried to speak to her daughter but was unable to appropriately use the phone. Subsequently 911 was called and EMS activated a code stroke when they recognize the hemianopia. She was taken for stat head CT which revealed no acute changes and CTA which revealed no large vessel occlusion. Given her symptoms, risks and  benefits of IV TPA were discussed with husband who agreed to proceed.  She was not an IR Thrombectomy candidate as there was no large vessel occlusion. Modified Rankin Scale: 3. She was admitted to the neuro ICU for further evaluation and treatment.   HOSPITAL COURSE Ms. Markella Lamphier is a 78 y.o. female with history of Carotid artery disease, HLD, HTN, depression, PD, dementia, osteoporosis, vit D deficiency. On 10/27 she presented with confusion and receptive aphasia, hemianopia . She received IV t-PA due, but there was no LVO for IA tx. Tolerated tPA well. MRI negative for stroke, felt to be possible seizure. Started on Keppra.   Stroke-like episode s/p tPA, possible Seizures  Resultant  Aphasia, confusion, hemianopia - resolved  Code Stroke CT Head no acute abnormality. Small vessel disease. Atrophy. Sinus dz. ASPECTS 10  CTA H&N - no LVO. Mild atherosclerosis   CT head - neg, no change  MRI head- no acute stroke. White matter dz, small vessel disease. Sinus dz  2D Echo - 60%EF, small PFO, no thrombus  Stat EEG findings are concerning for seizures given the left temporal sharps, will start on Keppra 1000mg  IV BID till PO is safe. Change to keppra po at d/c  [employer] Virus 2  neg  LDL - 86  HgbA1c - 5.6  UDS not done  none prior to admission, now on aspirin 325 mg daily. Change to aspirin 81 at d/c.   Therapy recommendations:  initially with needs but canceled due significant improvement  Disposition:  return home  Dr Tat follows for PD and dementia. F/u at d/c  Hypertension  Home BP meds: Cozaar  Current BP meds: none  Stable  Resume home meds at d/c  Long-term BP goal normotensive  Hyperlipidemia  Home Lipid lowering medication: Pravachol 40mg   LDL 86, goal < 100  Current lipid lowering medication: Since she is under goal and given age, will not change her home dose statin at this time.   Continue statin at discharge  Other Stroke Risk Factors  Advanced age  PFO, incidental finding  Other Active  Problems  Parkinson's Disease- con't home Sinemet  Dementia- risk for seizures and delirium. MOCA was 18. 6 mo ago by Dr Tat, her out pt neurologist.  DISCHARGE EXAM Blood pressure (!) 118/91, pulse (!) 52, temperature 97.8 F (36.6 C), temperature source Oral, resp. rate 14, weight 63 kg, SpO2 97 %. GEN: moderate distress, agitated with exam. Appears well-developed and well-nourished.  Psych: due to lack of attention/cooperation at this time, unable to assess. Eyes: No scleral injection HENT: No OP obstrucion Head: Normocephalic.  Cardiovascular: Normal rate and regular rhythm.  Respiratory: Effort normal and breath sounds normal to anterior ascultation GI: Soft. No distension. There is no tenderness.  Skin: warm, dry, intact.  Neuro: Mental Status: Patient is awake, alert. She poorly attends, can't follow commands. Only one word at a time spoken, she is unable to converse or comprehend at this time. She has severe left side neglect.  Cranial Nerves: [id number] hemianopia to blink, unable to count in VFs.pupils are equal, round, and reactive to light.  III,IV, VI:She has a right gaze preference, unable to track to left V: Facial sensation is symmetric to temperature VII: Facial movementwith mild left facial weakness VIII: hearing is intact to voice X: Uvula elevates symmetrically XI: Shoulder shrug is symmetric. XII: tongue is midline without atrophy or fasciculations.  Motor: 5/5 strength was present in all four extremities  Sensory: Sensation seems symmetric to  light touch; difficult d/t lack of cooperation. Cerebellar: unable  Discharge Diet   Dysphagia 3 thin liquids  DISCHARGE PLAN  Disposition:  Return home  Home health OT and SLP  aspirin 81 mg daily for stroke prevention   Ongoing stroke risk factor control by Primary Care Physician at time of discharge  Follow-up Moon, Amy A, NP in 2 weeks.  Follow-up Dr. Carles Collet in 4 weeks  35 minutes were spent  preparing discharge.  Burnetta Sabin, MSN, APRN, ANVP-BC, AGPCNP-BC Advanced Practice Stroke Nurse [place] for Schedule & Pager information 09/15/2019 2:25 PM  I have personally obtained history,examined this patient, reviewed notes, independently viewed imaging studies, participated in medical decision making and plan of care.ROS completed by me personally and pertinent positives fully documented  I have made any additions or clarifications directly to the above note. Agree with note above.    Antony Contras, MD Medical Director [facility] Pager: [phone] 09/15/2019 2:45 PM

## 2019-09-16 ENCOUNTER — Telehealth: Payer: Self-pay | Admitting: Neurology

## 2019-09-16 NOTE — Telephone Encounter (Signed)
/  Transition Care Management Follow-up Telephone Call   Date discharged? 09/15/19   How have you been since you were released from the hospital? NO   Do you understand why you were in the hospital? yes   Do you understand the discharge instructions? yes   Where were you discharged to? Home   Items Reviewed:  Medications reviewed: yes  Allergies reviewed: yes  Dietary changes reviewed: yes  Referrals reviewed: yes   Functional Questionnaire:   Activities of Daily Living (ADLs):   She states they are independent in the following: ambulation, bathing and hygiene, feeding, continence, grooming, toileting and dressing States they require assistance with the following: NO   Any transportation issues/concerns?: no   Any patient concerns? no   Confirmed importance and date/time of follow-up visits scheduled yes  Provider Appointment booked with Dr. Carles Collet on 10/07/19 @11 :15 am  Confirmed with patient if condition begins to worsen call our office, PCP or go to the ER.  Patient was given the office number and encouraged to call back with question or concerns.  : yes

## 2019-09-16 NOTE — Telephone Encounter (Signed)
Please do TCM phone call on the patient today (release from hospital last night).  Hinton Dyer, please put her on my 11/11 schedule.  Thank you.

## 2019-09-19 DIAGNOSIS — Z23 Encounter for immunization: Secondary | ICD-10-CM | POA: Diagnosis not present

## 2019-09-19 DIAGNOSIS — R569 Unspecified convulsions: Secondary | ICD-10-CM | POA: Diagnosis not present

## 2019-09-19 DIAGNOSIS — I10 Essential (primary) hypertension: Secondary | ICD-10-CM | POA: Diagnosis not present

## 2019-09-19 DIAGNOSIS — D539 Nutritional anemia, unspecified: Secondary | ICD-10-CM | POA: Diagnosis not present

## 2019-09-19 DIAGNOSIS — R7303 Prediabetes: Secondary | ICD-10-CM | POA: Diagnosis not present

## 2019-09-19 DIAGNOSIS — E785 Hyperlipidemia, unspecified: Secondary | ICD-10-CM | POA: Diagnosis not present

## 2019-09-19 DIAGNOSIS — Z79899 Other long term (current) drug therapy: Secondary | ICD-10-CM | POA: Diagnosis not present

## 2019-09-19 DIAGNOSIS — R2990 Unspecified symptoms and signs involving the nervous system: Secondary | ICD-10-CM | POA: Diagnosis not present

## 2019-09-19 DIAGNOSIS — N289 Disorder of kidney and ureter, unspecified: Secondary | ICD-10-CM | POA: Diagnosis not present

## 2019-09-20 ENCOUNTER — Encounter: Payer: Self-pay | Admitting: Neurology

## 2019-09-20 NOTE — Progress Notes (Signed)
TRANSITIONAL CARE MANAGEMENT NOTE  Virtual Visit via Video Note The purpose of this virtual visit is to provide medical care while limiting exposure to the novel coronavirus.    Consent was obtained for video visit:  Yes.   Answered questions that patient had about telehealth interaction:  Yes.   I discussed the limitations, risks, security and privacy concerns of performing an evaluation and management service by telemedicine. I also discussed with the patient that there may be a patient responsible charge related to this service. The patient expressed understanding and agreed to proceed.  Pt location: Home Physician Location: home Name of referring provider:  Donzetta Starch, NP I connected with Belinda Davis at patients initiation/request on 09/21/2019 at 11:15 AM EST by video enabled telemedicine application and verified that I am speaking with the correct person using two identifiers. Pt MRN:  [id number] Pt DOB:  08/02/1941 Video Participants:  Belinda Davis;     History of Present Illness:  Patient seen today for hospital follow-up.  Hospital records are reviewed.  She was in the hospital on September 13, 2019.  She went in with mental status change.  Her husband went to take a shower and when he emerged from the shower, he found her sitting at the table acting confused.  The patient was not able to appropriately use the phone to talk to her daughter (daughter was on the phone and she didn't know how to use it).  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  EEG done on October 28 demonstrated sharp transients in the left anterior temporal region, which were felt to be normal variants.  She subsequently had continuous EEG placed that failed to demonstrate any seizures.  There was intermittent generalized slowing as well as lateralized right hemispheric slowing.  The patient was given TPA.  Her MRI subsequently was negative and did not show any evidence of  infarct.  Dr. Leonie Man saw the patient and he felt that her episodes were likely seizure.  She was started on Keppra.  She is tolerating that well.  No mood change.  In fact, husband states that "nothing could ever make her mean."  She is back to her baseline mental status.  In regards to her Parkinson's disease, I did change her medication to carbidopa/levodopa 25/100 CR, 1 tablet at 8 AM/noon/4 PM/bedtime.  Husband states not taking the bedtime dosage.   She is also on clonazepam 0.5 mg, half tablet at bedtime.  No longer nauseated.    PREVIOUS MED:  carbidopa/levodopa 25/100 IR (nausea);  Pramipexole  (memory change)   Current Outpatient Medications on File Prior to Visit  Medication Sig Dispense Refill   aspirin EC 81 MG EC tablet Take 1 tablet (81 mg total) by mouth daily.     Carbidopa-Levodopa ER (SINEMET CR) 25-100 MG tablet controlled release Take one tablet at 8 am / noon / 4pm / bedtime. (Patient taking differently: Take 1 tablet by mouth See admin instructions. Take 1 tablet by mouth at 8 AM, 1 tablet at 12 noon, and 1 tablet at 4 PM) 120 tablet 1   Cholecalciferol (VITAMIN D-3 PO) Take 1 capsule by mouth at bedtime.     clonazePAM (KLONOPIN) 0.5 MG tablet TAKE 1/2 (ONE-HALF) TABLET BY MOUTH AT BEDTIME (Patient taking differently: Take 0.25 mg by mouth at bedtime. ) 45 tablet 0   levETIRAcetam (KEPPRA) 1000 MG tablet Take 1,000 mg by mouth 2 (two) times daily.  losartan (COZAAR) 100 MG tablet Take 100 mg by mouth at bedtime.      pravastatin (PRAVACHOL) 40 MG tablet Take 40 mg by mouth at bedtime.      No current facility-administered medications on file prior to visit.    Past Medical History:  Diagnosis Date   Carotid artery narrowing    Depression    Dyslipidemia    Essential hypertension    Malaise and fatigue    Memory loss    Osteoporosis    Tremor    Vitamin D deficiency    Past Surgical History:  Procedure Laterality Date   ARTHROSCOPY KNEE W/  DRILLING     Review of Systems  Constitutional: Negative.   HENT: Negative.   Eyes: Negative.   Respiratory: Negative.   Cardiovascular: Negative.   Genitourinary: Negative.   Musculoskeletal: Negative.   Skin: Negative.      Observations/Objective:   Vitals:   09/20/19 1035  Weight: 138 lb (62.6 kg)   GEN:  The patient appears stated age and is in NAD.  Neurological examination:  Orientation: The patient is alert and oriented x3. Cranial nerves: There is good facial symmetry. There is mild facial hypomimia.  The speech is fluent and clear. Soft palate rises symmetrically and there is no tongue deviation. Hearing is intact to conversational tone. Motor: Strength is at least antigravity x 4.   Shoulder shrug is equal and symmetric.  There is no pronator drift.  Movement examination: Tone: unable Abnormal movements: None seen today Coordination:  There is no significant decremation with RAM's, with hand opening and closing, finger taps or action of turning in a light bulb Gait and Station: The patient ambulates well in her home.    Assessment and Plan:   1.Idiopathic Parkinson's disease -Take carbidopa/levodopa 25/100 CR, 1 tablet at 8 AM/noon/4 PM.  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  I took her off of this primarily because of concerns of memory change.             -On clonazepam, 0.25 mg at bedtime.  Doing well with this.           3. Mental status change, possible seizure  -Presented to the hospital in October, 2020 with mental status change, receptive aphasia and hemianopsia.  MRI was negative, although she did receive TPA.  EEG with transient sharps in the left temporal region, although felt to be normal variant per report.  Nonetheless, patient was started on Keppra, 1000 mg twice  per day.  Discussed risk, benefits, and side effects with this medication with patient and her husband.  She has had no side effects with it thus far.  I will keep her on this for 6 months-1 year, and then consider weaning it if the EEG is normal.  Follow Up Instructions:  She has a follow-up appointment in February.  She will keep that.  -I discussed the assessment and treatment plan with the patient. The patient was provided an opportunity to ask questions and all were answered. The patient agreed with the plan and demonstrated an understanding of the instructions.   The patient was advised to call back or seek an in-person evaluation if the symptoms worsen or if the condition fails to improve as anticipated.    Total Time spent in visit with the patient was:  25 min, of which more than 50% of the time was spent in counseling and/or coordinating care.  Pt understands and agrees with the plan of care outlined.     Alonza Bogus, DO

## 2019-09-21 ENCOUNTER — Telehealth: Payer: Self-pay | Admitting: Neurology

## 2019-09-21 ENCOUNTER — Other Ambulatory Visit: Payer: Self-pay

## 2019-09-21 ENCOUNTER — Telehealth (INDEPENDENT_AMBULATORY_CARE_PROVIDER_SITE_OTHER): Payer: PPO | Admitting: Neurology

## 2019-09-21 VITALS — Wt 138.0 lb

## 2019-09-21 DIAGNOSIS — G20 Parkinson's disease: Secondary | ICD-10-CM

## 2019-09-21 DIAGNOSIS — R9401 Abnormal electroencephalogram [EEG]: Secondary | ICD-10-CM

## 2019-09-21 DIAGNOSIS — G20A1 Parkinson's disease without dyskinesia, without mention of fluctuations: Secondary | ICD-10-CM

## 2019-09-21 DIAGNOSIS — G4761 Periodic limb movement disorder: Secondary | ICD-10-CM | POA: Diagnosis not present

## 2019-09-21 NOTE — Telephone Encounter (Signed)
As long as they are driving and socially distancing from others, I have no objection (I think I remember Hinton Dyer telling me he was taking her to the beach for their anniversary).

## 2019-09-21 NOTE — Telephone Encounter (Signed)
Pt seen today via telemedicine visit. Ok to take her on a trip?

## 2019-09-21 NOTE — Telephone Encounter (Signed)
Left message with after hour service on 09-21-19 @ 12:56 pm   Caller states he wants to take his wife on a trip and wants to make sure it is ok with Dr Tat for them to go   Please call

## 2019-09-21 NOTE — Telephone Encounter (Signed)
Called spouse no answer left provider response on voice mail.

## 2019-10-03 ENCOUNTER — Telehealth: Payer: Self-pay | Admitting: Neurology

## 2019-10-03 DIAGNOSIS — G20 Parkinson's disease: Secondary | ICD-10-CM

## 2019-10-03 DIAGNOSIS — W19XXXA Unspecified fall, initial encounter: Secondary | ICD-10-CM

## 2019-10-03 DIAGNOSIS — R413 Other amnesia: Secondary | ICD-10-CM

## 2019-10-03 NOTE — Telephone Encounter (Addendum)
Patient's spouse called and said his wife's legs stopped working. She could not get out of the bathtub. He said she's gone downhill since she last saw Dr. Carles Collet. He'd like to know what to do to help her. She did not hurt herself, he confirmed, when she fell today.

## 2019-10-03 NOTE — Telephone Encounter (Signed)
1.  I just saw her on 11/4 via telemed.  What is different compared to then?  The only thing that the hospital added was keppra and in theory that should not change her walking.  Confirm her dosage.  Is she more confused?  Doing home PT?

## 2019-10-04 ENCOUNTER — Other Ambulatory Visit (INDEPENDENT_AMBULATORY_CARE_PROVIDER_SITE_OTHER): Payer: PPO

## 2019-10-04 ENCOUNTER — Other Ambulatory Visit: Payer: Self-pay

## 2019-10-04 DIAGNOSIS — R413 Other amnesia: Secondary | ICD-10-CM | POA: Diagnosis not present

## 2019-10-04 DIAGNOSIS — G20 Parkinson's disease: Secondary | ICD-10-CM | POA: Diagnosis not present

## 2019-10-04 DIAGNOSIS — W19XXXA Unspecified fall, initial encounter: Secondary | ICD-10-CM

## 2019-10-04 NOTE — Telephone Encounter (Signed)
Updated orders.

## 2019-10-04 NOTE — Telephone Encounter (Signed)
Patients husband stated that no PT is being done at all. They went walking a few days and only made it .75 miles. Legs are very weak. Not sure what is going on with her legs at all, she fell off the toilet this morning and hit her head on the shower. No signs of concussion. Confusion is stable but maybe a little worse he can not verify but is constantly asking the same questions. Please advise

## 2019-10-04 NOTE — Telephone Encounter (Signed)
Order [facility] for PT/OT to the house.  Yes, okay for wheeled walker (2 wheels up front, 2 tennis balls in the back).  Dx:  G20.  Can send that to advanced as well or give to husband when he comes (if I am still in [place], ask one of other physicians to sign)

## 2019-10-04 NOTE — Telephone Encounter (Signed)
Orders placed for blood work and physical therapy. Husband refused the ER and I discussed with him the reasoning for taking her and that if she worsens in any way to please take her and he stated he does not feel she has a need for that. States he will be here before end of the day with her for the blood work. He mentioned a wheel chair or walker of some sort for the patient being possibly ordered?

## 2019-10-04 NOTE — Telephone Encounter (Signed)
Patient husband wants to know which home health care we referred her to and some one information as to what he needs to expect when they call and start coming

## 2019-10-04 NOTE — Telephone Encounter (Signed)
[place], patients husband is aware

## 2019-10-04 NOTE — Telephone Encounter (Signed)
May need to go to ER for evaluation if significantly acutely worse.  Make sure no lateralizing weakness or paresthesias.  Check chem, cbc, UA with c&s.  Order home PT, dx:  [place], PD

## 2019-10-05 ENCOUNTER — Telehealth: Payer: Self-pay

## 2019-10-05 MED ORDER — LEVETIRACETAM 500 MG PO TABS: 500.0000 mg | ORAL_TABLET | Freq: Two times a day (BID) | ORAL | 3 refills | Status: DC

## 2019-10-05 NOTE — Telephone Encounter (Signed)
Sending to clinical staff for review: Okay to sign/close encounter or is further follow up needed? ° °

## 2019-10-05 NOTE — Telephone Encounter (Signed)
-----   Message from [place], DO sent at 10/05/2019  7:49 AM EST ----- Let pt/husband know that I am awaiting results of urine cx but so far, she looks dehydrated compared to a few weeks ago.  Please fax labs to PCP and have pt f/u

## 2019-10-05 NOTE — Telephone Encounter (Signed)
Can close.

## 2019-10-05 NOTE — Telephone Encounter (Signed)
Will you close this encounter please. I am getting an error on this end:   Required This encounter contains orders that do not have an associated diagnosis.  Please associate these orders before closing this encounter.

## 2019-10-05 NOTE — Telephone Encounter (Signed)
Patient husband aware of results and recommendations.  Per patient husband, she did not have these issues prior to taking Keppra.  Patients husband wants know if she should continue keppra, as it is due for a refill.  Please advise.

## 2019-10-05 NOTE — Telephone Encounter (Signed)
New Keppra 500mg  rx sent to pharmacy.  Patient husband aware.

## 2019-10-05 NOTE — Telephone Encounter (Signed)
Lets decrease the keppra dose to 500 mg bid.  Can call in new dose (currently on 1000mg  bid).

## 2019-10-06 LAB — URINALYSIS W MICROSCOPIC + REFLEX CULTURE
Bacteria, UA: NONE SEEN /HPF
Bilirubin Urine: NEGATIVE
Glucose, UA: NEGATIVE
Hgb urine dipstick: NEGATIVE
Hyaline Cast: NONE SEEN /LPF
Nitrites, Initial: NEGATIVE
Protein, ur: NEGATIVE
RBC / HPF: NONE SEEN /HPF (ref 0–2)
Specific Gravity, Urine: 1.02 (ref 1.001–1.03)
WBC, UA: NONE SEEN /HPF (ref 0–5)
pH: 5.5 (ref 5.0–8.0)

## 2019-10-06 LAB — CBC WITH DIFFERENTIAL/PLATELET
Absolute Monocytes: 562 cells/uL (ref 200–950)
Basophils Absolute: 11 cells/uL (ref 0–200)
Basophils Relative: 0.2 %
Eosinophils Absolute: 81 cells/uL (ref 15–500)
Eosinophils Relative: 1.5 %
HCT: 35.2 % (ref 35.0–45.0)
Hemoglobin: 11.6 g/dL — ABNORMAL LOW (ref 11.7–15.5)
Lymphs Abs: 1280 cells/uL (ref 850–3900)
MCH: 31.4 pg (ref 27.0–33.0)
MCHC: 33 g/dL (ref 32.0–36.0)
MCV: 95.4 fL (ref 80.0–100.0)
MPV: 11.7 fL (ref 7.5–12.5)
Monocytes Relative: 10.4 %
Neutro Abs: 3467 cells/uL (ref 1500–7800)
Neutrophils Relative %: 64.2 %
Platelets: 199 10*3/uL (ref 140–400)
RBC: 3.69 10*6/uL — ABNORMAL LOW (ref 3.80–5.10)
RDW: 12.5 % (ref 11.0–15.0)
Total Lymphocyte: 23.7 %
WBC: 5.4 10*3/uL (ref 3.8–10.8)

## 2019-10-06 LAB — COMPREHENSIVE METABOLIC PANEL
AG Ratio: 1.3 (calc) (ref 1.0–2.5)
ALT: 7 U/L (ref 6–29)
AST: 14 U/L (ref 10–35)
Albumin: 4 g/dL (ref 3.6–5.1)
Alkaline phosphatase (APISO): 56 U/L (ref 37–153)
BUN/Creatinine Ratio: 14 (calc) (ref 6–22)
BUN: 21 mg/dL (ref 7–25)
CO2: 25 mmol/L (ref 20–32)
Calcium: 9.5 mg/dL (ref 8.6–10.4)
Chloride: 107 mmol/L (ref 98–110)
Creat: 1.47 mg/dL — ABNORMAL HIGH (ref 0.60–0.93)
Globulin: 3.1 g/dL (calc) (ref 1.9–3.7)
Glucose, Bld: 97 mg/dL (ref 65–99)
Potassium: 4.5 mmol/L (ref 3.5–5.3)
Sodium: 142 mmol/L (ref 135–146)
Total Bilirubin: 0.8 mg/dL (ref 0.2–1.2)
Total Protein: 7.1 g/dL (ref 6.1–8.1)

## 2019-10-06 LAB — CULTURE INDICATED

## 2019-10-06 LAB — URINE CULTURE
MICRO NUMBER:: 1111204
Result:: NO GROWTH
SPECIMEN QUALITY:: ADEQUATE

## 2019-10-07 ENCOUNTER — Ambulatory Visit: Payer: PPO | Admitting: Neurology

## 2019-10-07 ENCOUNTER — Telehealth: Payer: Self-pay | Admitting: Neurology

## 2019-10-07 NOTE — Telephone Encounter (Signed)
1. I think that I just changed/reduced her keppra a few days ago.  Give that some time.  It can cause personality change.  May need to change that med all together but I significantly reduced it 2.  See last note.  They are taking carbidopa/levodopa 25/100 ok.

## 2019-10-07 NOTE — Telephone Encounter (Signed)
Please advise on below  

## 2019-10-07 NOTE — Telephone Encounter (Signed)
Patients husband is aware, and no notes made anywhere that she takes a bedtime dose and it is not on RX.   Husband states that she gets a awful headache every day that is so bad she can not move and he believes that [place] is the culprit. Please advise

## 2019-10-07 NOTE — Telephone Encounter (Signed)
That would be very, very unusual for Keppra to cause headache.  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  F/u with PCP about that.

## 2019-10-07 NOTE — Telephone Encounter (Signed)
Belinda Davis is aware and is calling PCP

## 2019-10-07 NOTE — Telephone Encounter (Signed)
Patient's husband called with concerns about his wife having a personality change at around 4:30 PM daily. He said she has no motivation to do anything and cannot remember her daily medication routine despite his explaining them to her in detail. He said he is making sure she is taking her medications.   However, he expressed concern that he's been giving her carbidopa/levodopa 25-100 MG 3 times a day at 8:00 AM, 12:00 PM, and 4:00 PM but noticed on the bottle the directions indicate she should have it at bedtime as well.  [facility] in [place]

## 2019-10-10 DIAGNOSIS — Z6824 Body mass index (BMI) 24.0-24.9, adult: Secondary | ICD-10-CM | POA: Diagnosis not present

## 2019-10-10 DIAGNOSIS — K5900 Constipation, unspecified: Secondary | ICD-10-CM | POA: Diagnosis not present

## 2019-10-10 DIAGNOSIS — I10 Essential (primary) hypertension: Secondary | ICD-10-CM | POA: Diagnosis not present

## 2019-10-10 DIAGNOSIS — D489 Neoplasm of uncertain behavior, unspecified: Secondary | ICD-10-CM | POA: Diagnosis not present

## 2019-10-10 DIAGNOSIS — R569 Unspecified convulsions: Secondary | ICD-10-CM | POA: Diagnosis not present

## 2019-10-11 NOTE — Telephone Encounter (Signed)
Sending to clinical staff for review: Okay to sign/close encounter or is further follow up needed? ° °

## 2019-10-26 ENCOUNTER — Other Ambulatory Visit: Payer: Self-pay | Admitting: Neurology

## 2019-10-27 DIAGNOSIS — I10 Essential (primary) hypertension: Secondary | ICD-10-CM | POA: Diagnosis not present

## 2019-10-27 DIAGNOSIS — G20 Parkinson's disease: Secondary | ICD-10-CM | POA: Diagnosis not present

## 2019-10-27 DIAGNOSIS — Z6824 Body mass index (BMI) 24.0-24.9, adult: Secondary | ICD-10-CM | POA: Diagnosis not present

## 2019-10-27 DIAGNOSIS — N289 Disorder of kidney and ureter, unspecified: Secondary | ICD-10-CM | POA: Diagnosis not present

## 2019-10-27 DIAGNOSIS — R569 Unspecified convulsions: Secondary | ICD-10-CM | POA: Diagnosis not present

## 2019-10-27 DIAGNOSIS — D539 Nutritional anemia, unspecified: Secondary | ICD-10-CM | POA: Diagnosis not present

## 2019-10-27 DIAGNOSIS — R413 Other amnesia: Secondary | ICD-10-CM | POA: Diagnosis not present

## 2019-10-27 DIAGNOSIS — E785 Hyperlipidemia, unspecified: Secondary | ICD-10-CM | POA: Diagnosis not present

## 2019-11-23 ENCOUNTER — Other Ambulatory Visit: Payer: Self-pay | Admitting: Neurology

## 2019-11-23 DIAGNOSIS — G20 Parkinson's disease: Secondary | ICD-10-CM

## 2019-12-16 ENCOUNTER — Encounter: Payer: Self-pay | Admitting: Neurology

## 2019-12-16 NOTE — Progress Notes (Signed)
Virtual Visit via Video Note The purpose of this virtual visit is to provide medical care while limiting exposure to the novel coronavirus.    Consent was obtained for video visit:  Yes.   Answered questions that patient had about telehealth interaction:  Yes.   I discussed the limitations, risks, security and privacy concerns of performing an evaluation and management service by telemedicine. I also discussed with the patient that there may be a patient responsible charge related to this service. The patient expressed understanding and agreed to proceed.  Pt location: Home Physician Location: home Name of referring provider:  Lowella Dandy, NP I connected with Belinda Davis at patients initiation/request on 12/19/2019 at  1:00 PM EST by video enabled telemedicine application and verified that I am speaking with the correct person using two identifiers. Pt MRN:  [id number] Pt DOB:  Jul 27, 1941 Video Participants:  Belinda Davis;  Husband supplements hx   History of Present Illness:  Patient seen today in follow-up for her Parkinson's disease, as well as possible seizure.  The hospital had placed her on Keppra, 1000 mg twice per day.  Her husband started to notice more trouble walking, and she fell off the toilet.  He felt that these were side effects from the [place], which we ultimately decreased to 500 mg twice per day.  Husband states that "boy, she is doing so good." she is more independent with ADL's.   No falls.  She is walking for exercise some.  No hallucinations.    Current movement d/o meds: Carbidopa/levodopa 25/100 CR, 1 tablet 3 times per day Keppra, 500 mg twice per day Klonopin, 0.5 mg, 1/2 q hs for PLMD   Current Outpatient Medications on File Prior to Visit  Medication Sig Dispense Refill  . aspirin EC 81 MG EC tablet Take 1 tablet (81 mg total) by mouth daily.    . Carbidopa-Levodopa ER (SINEMET CR) 25-100 MG tablet controlled release TAKE ONE TABLET BY MOUTH AT 8  AM, NOON, 4 PM AND BEDTIME 120 tablet 0  . Cholecalciferol (VITAMIN D-3 PO) Take 1 capsule by mouth at bedtime.    . clonazePAM (KLONOPIN) 0.5 MG tablet Take 0.5 tablets (0.25 mg total) by mouth at bedtime. 45 tablet 1  . levETIRAcetam (KEPPRA) 500 MG tablet Take 1 tablet (500 mg total) by mouth 2 (two) times daily. 180 tablet 3  . losartan (COZAAR) 100 MG tablet Take 100 mg by mouth at bedtime.     . pravastatin (PRAVACHOL) 40 MG tablet Take 40 mg by mouth at bedtime.      No current facility-administered medications on file prior to visit.     Observations/Objective:   There were no vitals filed for this visit. GEN:  The patient appears stated age and is in NAD.  Neurological examination:  Orientation: The patient is alert and oriented x3. Cranial nerves: There is good facial symmetry. There is mild facial hypomimia.  The speech is fluent and clear. Soft palate rises symmetrically and there is no tongue deviation. Hearing is intact to conversational tone. Motor: Strength is at least antigravity x 4.   Shoulder shrug is equal and symmetric.  There is no pronator drift.  Movement examination: Tone: unable Abnormal movements: none Coordination:  There is no decremation with RAM's, with any form of RAMS, including alternating supination and pronation of the forearm, hand opening and closing, finger taps Gait and Station: The patient has no difficulty arising out of a deep-seated chair without the use  of the hands. The patient's stride length is good with mild decreased arm swing on right.    I have reviewed and interpreted the following labs independently   Chemistry      Component Value Date/Time   NA 142 10/04/2019 0855   K 4.5 10/04/2019 0855   CL 107 10/04/2019 0855   CO2 25 10/04/2019 0855   BUN 21 10/04/2019 0855   CREATININE 1.47 (H) 10/04/2019 0855      Component Value Date/Time   CALCIUM 9.5 10/04/2019 0855   ALKPHOS 58 09/13/2019 1822   AST 14 10/04/2019 0855   ALT 7  10/04/2019 0855   BILITOT 0.8 10/04/2019 0855     Lab Results  Component Value Date   WBC 5.4 10/04/2019   HGB 11.6 (L) 10/04/2019   HCT 35.2 10/04/2019   MCV 95.4 10/04/2019   PLT 199 10/04/2019     Assessment and Plan:   1.Idiopathic Parkinson's disease -Take carbidopa/levodopa 25/100 CR, 1 tablet at 8 AM/noon/4 PM.  They stopped the bedtime dosage, but feels that she is doing well (actually not sure they ever started it)  -discussed covid vaccine and encouraged it.  Asked many questions and answered to best of my ability.  2. 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 I took her off of this primarily because of concerns of memory change. -On clonazepam, 0.25 mg at bedtime. Doing well with this.   3. Mental status change, possible seizure             -Presented to the hospital in October, 2020 with mental status change, receptive aphasia and hemianopsia.  MRI was negative, although she did receive TPA.  EEG with transient sharps in the left temporal region, although felt to be normal variant per report.  Nonetheless, patient was started on Keppra, 1000 mg twice per day.  had SE at this high dose.  Now on 500 mg bid.  Discussed risk, benefits, and side effects with this medication with patient and her husband. Will keep her on this until next visit, do EEG and then consider weaning if no epileptiform activity (will likely be slow).  Follow Up Instructions:  6 months  -I discussed the assessment and treatment plan with the patient. The patient was provided an opportunity to ask questions and all were answered. The patient agreed with the plan and demonstrated an understanding of the instructions.   The patient was advised to call back or seek an in-person evaluation if the symptoms worsen or if the condition fails to improve as anticipated.    Total time spent on today's visit was 30 minutes,  including both face-to-face time and nonface-to-face time.  Time included that spent on review of records (prior notes available to me/labs/imaging if pertinent), discussing treatment and goals, answering patient's questions and coordinating care.   Alonza Bogus, DO

## 2019-12-19 ENCOUNTER — Telehealth (INDEPENDENT_AMBULATORY_CARE_PROVIDER_SITE_OTHER): Payer: PPO | Admitting: Neurology

## 2019-12-19 ENCOUNTER — Encounter: Payer: Self-pay | Admitting: Neurology

## 2019-12-19 ENCOUNTER — Other Ambulatory Visit: Payer: Self-pay

## 2019-12-19 DIAGNOSIS — R9401 Abnormal electroencephalogram [EEG]: Secondary | ICD-10-CM

## 2019-12-19 DIAGNOSIS — G20 Parkinson's disease: Secondary | ICD-10-CM

## 2019-12-20 ENCOUNTER — Telehealth: Payer: PPO | Admitting: Neurology

## 2019-12-29 ENCOUNTER — Other Ambulatory Visit: Payer: Self-pay | Admitting: Neurology

## 2019-12-29 DIAGNOSIS — G20 Parkinson's disease: Secondary | ICD-10-CM

## 2020-01-27 DIAGNOSIS — R413 Other amnesia: Secondary | ICD-10-CM | POA: Diagnosis not present

## 2020-01-27 DIAGNOSIS — D539 Nutritional anemia, unspecified: Secondary | ICD-10-CM | POA: Diagnosis not present

## 2020-01-27 DIAGNOSIS — E785 Hyperlipidemia, unspecified: Secondary | ICD-10-CM | POA: Diagnosis not present

## 2020-01-27 DIAGNOSIS — G20 Parkinson's disease: Secondary | ICD-10-CM | POA: Diagnosis not present

## 2020-01-27 DIAGNOSIS — N289 Disorder of kidney and ureter, unspecified: Secondary | ICD-10-CM | POA: Diagnosis not present

## 2020-01-27 DIAGNOSIS — R569 Unspecified convulsions: Secondary | ICD-10-CM | POA: Diagnosis not present

## 2020-01-27 DIAGNOSIS — I10 Essential (primary) hypertension: Secondary | ICD-10-CM | POA: Diagnosis not present

## 2020-01-27 DIAGNOSIS — Z6825 Body mass index (BMI) 25.0-25.9, adult: Secondary | ICD-10-CM | POA: Diagnosis not present

## 2020-02-01 DIAGNOSIS — Z1212 Encounter for screening for malignant neoplasm of rectum: Secondary | ICD-10-CM | POA: Diagnosis not present

## 2020-02-09 ENCOUNTER — Other Ambulatory Visit: Payer: Self-pay | Admitting: Neurology

## 2020-02-09 DIAGNOSIS — G20 Parkinson's disease: Secondary | ICD-10-CM

## 2020-02-20 DIAGNOSIS — K648 Other hemorrhoids: Secondary | ICD-10-CM | POA: Diagnosis not present

## 2020-02-20 DIAGNOSIS — K5730 Diverticulosis of large intestine without perforation or abscess without bleeding: Secondary | ICD-10-CM | POA: Diagnosis not present

## 2020-03-01 DIAGNOSIS — Z20828 Contact with and (suspected) exposure to other viral communicable diseases: Secondary | ICD-10-CM | POA: Diagnosis not present

## 2020-03-08 DIAGNOSIS — K921 Melena: Secondary | ICD-10-CM | POA: Diagnosis not present

## 2020-03-08 DIAGNOSIS — K644 Residual hemorrhoidal skin tags: Secondary | ICD-10-CM | POA: Diagnosis not present

## 2020-03-08 DIAGNOSIS — K5730 Diverticulosis of large intestine without perforation or abscess without bleeding: Secondary | ICD-10-CM | POA: Diagnosis not present

## 2020-03-08 DIAGNOSIS — G20 Parkinson's disease: Secondary | ICD-10-CM | POA: Diagnosis not present

## 2020-03-08 DIAGNOSIS — K648 Other hemorrhoids: Secondary | ICD-10-CM | POA: Diagnosis not present

## 2020-03-08 DIAGNOSIS — E538 Deficiency of other specified B group vitamins: Secondary | ICD-10-CM | POA: Diagnosis not present

## 2020-03-08 DIAGNOSIS — R569 Unspecified convulsions: Secondary | ICD-10-CM | POA: Diagnosis not present

## 2020-03-08 DIAGNOSIS — Z1211 Encounter for screening for malignant neoplasm of colon: Secondary | ICD-10-CM | POA: Diagnosis not present

## 2020-03-08 DIAGNOSIS — Z79899 Other long term (current) drug therapy: Secondary | ICD-10-CM | POA: Diagnosis not present

## 2020-03-08 DIAGNOSIS — I10 Essential (primary) hypertension: Secondary | ICD-10-CM | POA: Diagnosis not present

## 2020-04-16 ENCOUNTER — Other Ambulatory Visit: Payer: Self-pay | Admitting: Neurology

## 2020-04-17 NOTE — Telephone Encounter (Signed)
Rx(s) sent to pharmacy electronically.  

## 2020-05-04 ENCOUNTER — Other Ambulatory Visit: Payer: Self-pay | Admitting: Neurology

## 2020-05-04 DIAGNOSIS — G20 Parkinson's disease: Secondary | ICD-10-CM

## 2020-05-04 DIAGNOSIS — I10 Essential (primary) hypertension: Secondary | ICD-10-CM | POA: Diagnosis not present

## 2020-05-04 DIAGNOSIS — Z6826 Body mass index (BMI) 26.0-26.9, adult: Secondary | ICD-10-CM | POA: Diagnosis not present

## 2020-05-04 DIAGNOSIS — E785 Hyperlipidemia, unspecified: Secondary | ICD-10-CM | POA: Diagnosis not present

## 2020-05-04 DIAGNOSIS — R569 Unspecified convulsions: Secondary | ICD-10-CM | POA: Diagnosis not present

## 2020-05-04 DIAGNOSIS — N289 Disorder of kidney and ureter, unspecified: Secondary | ICD-10-CM | POA: Diagnosis not present

## 2020-05-04 DIAGNOSIS — D539 Nutritional anemia, unspecified: Secondary | ICD-10-CM | POA: Diagnosis not present

## 2020-05-04 DIAGNOSIS — R413 Other amnesia: Secondary | ICD-10-CM | POA: Diagnosis not present

## 2020-05-04 NOTE — Telephone Encounter (Signed)
Rx(s) sent to pharmacy electronically.  

## 2020-05-31 DIAGNOSIS — Z9181 History of falling: Secondary | ICD-10-CM | POA: Diagnosis not present

## 2020-05-31 DIAGNOSIS — E785 Hyperlipidemia, unspecified: Secondary | ICD-10-CM | POA: Diagnosis not present

## 2020-05-31 DIAGNOSIS — Z1331 Encounter for screening for depression: Secondary | ICD-10-CM | POA: Diagnosis not present

## 2020-05-31 DIAGNOSIS — Z0000 Encounter for general adult medical examination without abnormal findings: Secondary | ICD-10-CM | POA: Diagnosis not present

## 2020-05-31 DIAGNOSIS — Z139 Encounter for screening, unspecified: Secondary | ICD-10-CM | POA: Diagnosis not present

## 2020-06-03 ENCOUNTER — Other Ambulatory Visit: Payer: Self-pay | Admitting: Neurology

## 2020-06-03 DIAGNOSIS — G20 Parkinson's disease: Secondary | ICD-10-CM

## 2020-06-29 NOTE — Progress Notes (Signed)
Assessment/Plan:   1.  Parkinsons Disease  -Continue carbidopa/levodopa 25/100 CR, 1 tablet at 8 AM/noon/4 PM/8pm  2.  PLMD  -Continue clonazepam, 0.5 mg, half tablet at bedtime  3.  History of mental status change, possible seizure  -Presented to the hospital in October, 2020 with mental status change, receptive aphasia and hemianopsia.  Received TPA.  MRI was negative.  EEG with transient sharps in the left temporal region, felt to be a normal variant.  Patient was nonetheless started on Keppra, which had to be decreased because of side effects.  -We discussed doing ambulatory EEG and if negative tapering her off of the Keppra.  Ultimately, because she is doing well, patient and her husband decided to stay on the [place] and not change anything.     Subjective:   Belinda Davis was seen today in follow up for Parkinsons disease.  My previous records were reviewed prior to todays visit as well as outside records available to me. Pt denies falls.  Pt denies lightheadedness, near syncope.  No hallucinations.  Mood has been good.  Memory is problematic, but husband helps with ADLs.  Husband states that really she has been doing quite well.  Current prescribed movement disorder medications: Keppra, 500 mg twice per day Clonazepam 0.5 mg, half tablet at bedtime for PLMD Carbidopa/levodopa 25/100 CR, 1 tablet qid   ALLERGIES:  No Known Allergies  CURRENT MEDICATIONS:  Outpatient Encounter Medications as of 07/03/2020  Medication Sig  . aspirin EC 81 MG EC tablet Take 1 tablet (81 mg total) by mouth daily.  . Carbidopa-Levodopa ER (SINEMET CR) 25-100 MG tablet controlled release TAKE ONCE DAILY 1 TABLET BY MOUTH AT 8AM, THEN 1 TABLET AT 12PM, THEN 1 TABLET AT 4PM AND 1 TABLET BY MOUTH AT BEDTIME  . Cholecalciferol (VITAMIN D-3 PO) Take 1 capsule by mouth at bedtime.  . clonazePAM (KLONOPIN) 0.5 MG tablet TAKE 1/2 (ONE-HALF) TABLET BY MOUTH AT BEDTIME  . hydrochlorothiazide  (HYDRODIURIL) 12.5 MG tablet Take 12.5 mg by mouth daily.  Marland Kitchen levETIRAcetam (KEPPRA) 500 MG tablet Take 1 tablet (500 mg total) by mouth 2 (two) times daily.  Marland Kitchen losartan (COZAAR) 100 MG tablet Take 100 mg by mouth at bedtime.   . pravastatin (PRAVACHOL) 40 MG tablet Take 40 mg by mouth at bedtime.   . vitamin B-12 (CYANOCOBALAMIN) 1000 MCG tablet Take 1,000 mcg by mouth daily.   No facility-administered encounter medications on file as of 07/03/2020.    Objective:   PHYSICAL EXAMINATION:    VITALS:   Vitals:   07/03/20 1047  BP: (!) 147/69  Pulse: 63  SpO2: 95%  Weight: 137 lb (62.1 kg)  Height: 5\' 2"  (1.575 m)    GEN:  The patient appears stated age and is in NAD. HEENT:  Normocephalic, atraumatic.  The mucous membranes are moist. The superficial temporal arteries are without ropiness or tenderness. CV:  RRR Lungs:  CTAB Neck/HEME:  There are no carotid bruits bilaterally.  Neurological examination:  Orientation: The patient is alert and oriented x3. Cranial nerves: There is good facial symmetry with facial hypomimia. The speech is fluent and clear. Soft palate rises symmetrically and there is no tongue deviation. Hearing is intact to conversational tone. Sensation: Sensation is intact to light touch throughout Motor: Strength is at least antigravity x4.  Movement examination: Tone: There is normal tone in the upper and lower extremities Abnormal movements: Rare tremor in the left thumb Coordination:  There is no significant decremation  with RAM's,  Gait and Station: The patient has no difficulty arising out of a deep-seated chair without the use of the hands. The patient's stride length is good but she slightly drags the left leg.    I have reviewed and interpreted the following labs independently    Chemistry      Component Value Date/Time   NA 142 10/04/2019 0855   K 4.5 10/04/2019 0855   CL 107 10/04/2019 0855   CO2 25 10/04/2019 0855   BUN 21 10/04/2019 0855    CREATININE 1.47 (H) 10/04/2019 0855      Component Value Date/Time   CALCIUM 9.5 10/04/2019 0855   ALKPHOS 58 09/13/2019 1822   AST 14 10/04/2019 0855   ALT 7 10/04/2019 0855   BILITOT 0.8 10/04/2019 0855       Lab Results  Component Value Date   WBC 5.4 10/04/2019   HGB 11.6 (L) 10/04/2019   HCT 35.2 10/04/2019   MCV 95.4 10/04/2019   PLT 199 10/04/2019    No results found for: TSH   Total time spent on today's visit was 30 minutes, including both face-to-face time and nonface-to-face time.  Time included that spent on review of records (prior notes available to me/labs/imaging if pertinent), discussing treatment and goals, answering patient's questions and coordinating care.  Cc:  Lowella Dandy, NP

## 2020-07-01 ENCOUNTER — Other Ambulatory Visit: Payer: Self-pay | Admitting: Neurology

## 2020-07-01 DIAGNOSIS — G20 Parkinson's disease: Secondary | ICD-10-CM

## 2020-07-03 ENCOUNTER — Encounter: Payer: Self-pay | Admitting: Neurology

## 2020-07-03 ENCOUNTER — Ambulatory Visit: Payer: PPO | Admitting: Neurology

## 2020-07-03 ENCOUNTER — Other Ambulatory Visit: Payer: Self-pay

## 2020-07-03 VITALS — BP 147/69 | HR 63 | Ht 62.0 in | Wt 137.0 lb

## 2020-07-03 DIAGNOSIS — G20 Parkinson's disease: Secondary | ICD-10-CM | POA: Diagnosis not present

## 2020-07-03 DIAGNOSIS — H2513 Age-related nuclear cataract, bilateral: Secondary | ICD-10-CM | POA: Diagnosis not present

## 2020-07-03 DIAGNOSIS — R9401 Abnormal electroencephalogram [EEG]: Secondary | ICD-10-CM | POA: Diagnosis not present

## 2020-07-03 DIAGNOSIS — H353121 Nonexudative age-related macular degeneration, left eye, early dry stage: Secondary | ICD-10-CM | POA: Diagnosis not present

## 2020-07-03 DIAGNOSIS — H25013 Cortical age-related cataract, bilateral: Secondary | ICD-10-CM | POA: Diagnosis not present

## 2020-07-03 DIAGNOSIS — H35013 Changes in retinal vascular appearance, bilateral: Secondary | ICD-10-CM | POA: Diagnosis not present

## 2020-07-03 MED ORDER — CARBIDOPA-LEVODOPA ER 25-100 MG PO TBCR: EXTENDED_RELEASE_TABLET | ORAL | 1 refills | Status: DC

## 2020-07-03 NOTE — Patient Instructions (Signed)
No changes in your medication!  The physicians and staff at [facility] are committed to providing excellent care. You may receive a survey requesting feedback about your experience at our office. We strive to receive "very good" responses to the survey questions. If you feel that your experience would prevent you from giving the office a "very good " response, please contact our office to try to remedy the situation. We may be reached at [phone]. Thank you for taking the time out of your busy day to complete the survey.

## 2020-07-04 DIAGNOSIS — Z1231 Encounter for screening mammogram for malignant neoplasm of breast: Secondary | ICD-10-CM | POA: Diagnosis not present

## 2020-07-12 ENCOUNTER — Telehealth: Payer: Self-pay | Admitting: Neurology

## 2020-07-12 MED ORDER — CLONAZEPAM 0.5 MG PO TABS: 0.5000 mg | ORAL_TABLET | Freq: Every day | ORAL | 0 refills | Status: DC

## 2020-07-12 NOTE — Telephone Encounter (Signed)
Patient's husband called in stating the patient usually takes 1/2 of a Klonopin at bedtime. He stated she's been taking 1 while pill the last couple of nights and that seems to be doing better for her. He would like to make sure that is ok?

## 2020-07-12 NOTE — Telephone Encounter (Signed)
No objection from my standpoint to the vitamins, but also tell him that I would support the covid vaccine in her if she hasn't already.  Re: increase in klonopin.  This can cause increased confusion and falls.  Has he noted any of this?  If no, we can trial the 1 at night but no more.  Please correct RX and let us know if needs new RX

## 2020-07-12 NOTE — Telephone Encounter (Signed)
Spoke with patients spouse and he states he will try the 1 tablet at night. He stated this medication helps her legs from jumping at night.   Spouse states they will do the vitamins however they are unsure which way to go with the vaccine.   Gaved Dr Tat's recommendations he voiced understanding.

## 2020-07-12 NOTE — Telephone Encounter (Signed)
Patient's husband called in again. He has a second question. Would the patient be able to take Vitamin C and Zinc along with her current Vitamin D3 to help prevent colds/COVID-19?

## 2020-07-15 ENCOUNTER — Other Ambulatory Visit: Payer: Self-pay | Admitting: Neurology

## 2020-08-14 DIAGNOSIS — H2513 Age-related nuclear cataract, bilateral: Secondary | ICD-10-CM | POA: Diagnosis not present

## 2020-08-14 DIAGNOSIS — H2511 Age-related nuclear cataract, right eye: Secondary | ICD-10-CM | POA: Diagnosis not present

## 2020-08-14 DIAGNOSIS — H18413 Arcus senilis, bilateral: Secondary | ICD-10-CM | POA: Diagnosis not present

## 2020-08-14 DIAGNOSIS — H25013 Cortical age-related cataract, bilateral: Secondary | ICD-10-CM | POA: Diagnosis not present

## 2020-08-14 DIAGNOSIS — H25043 Posterior subcapsular polar age-related cataract, bilateral: Secondary | ICD-10-CM | POA: Diagnosis not present

## 2020-08-14 DIAGNOSIS — H353131 Nonexudative age-related macular degeneration, bilateral, early dry stage: Secondary | ICD-10-CM | POA: Diagnosis not present

## 2020-08-17 DIAGNOSIS — G20 Parkinson's disease: Secondary | ICD-10-CM | POA: Diagnosis not present

## 2020-08-17 DIAGNOSIS — R413 Other amnesia: Secondary | ICD-10-CM | POA: Diagnosis not present

## 2020-08-17 DIAGNOSIS — Z6826 Body mass index (BMI) 26.0-26.9, adult: Secondary | ICD-10-CM | POA: Diagnosis not present

## 2020-08-17 DIAGNOSIS — I10 Essential (primary) hypertension: Secondary | ICD-10-CM | POA: Diagnosis not present

## 2020-08-17 DIAGNOSIS — D539 Nutritional anemia, unspecified: Secondary | ICD-10-CM | POA: Diagnosis not present

## 2020-08-17 DIAGNOSIS — E785 Hyperlipidemia, unspecified: Secondary | ICD-10-CM | POA: Diagnosis not present

## 2020-08-17 DIAGNOSIS — R569 Unspecified convulsions: Secondary | ICD-10-CM | POA: Diagnosis not present

## 2020-08-20 DIAGNOSIS — H2511 Age-related nuclear cataract, right eye: Secondary | ICD-10-CM | POA: Diagnosis not present

## 2020-08-21 DIAGNOSIS — H2512 Age-related nuclear cataract, left eye: Secondary | ICD-10-CM | POA: Diagnosis not present

## 2020-08-27 ENCOUNTER — Telehealth: Payer: Self-pay | Admitting: Neurology

## 2020-08-27 MED ORDER — BUSPIRONE HCL 5 MG PO TABS: 5.0000 mg | ORAL_TABLET | Freq: Every day | ORAL | 1 refills | Status: DC

## 2020-08-27 NOTE — Telephone Encounter (Signed)
Nothing that would take the place of klonopin because it is being used for the legs.  Some of the meds for mood will worsen that so we need to be careful.  If he would like, we can try low dose buspar, 5 mg daily for the anxiety

## 2020-08-27 NOTE — Telephone Encounter (Signed)
Spoke with patients spouse and he stated he would like for the patient to have this rx to take during the day if she needs it.   Rx(s) sent to pharmacy electronically.

## 2020-08-27 NOTE — Telephone Encounter (Signed)
Spoke with patient and she states she took a clonazepam during the day and it made her very nervous. She states she has went back to taking the medication at night and she is doing better.   Spoke with patients spouse, he states the patient gets very nervous. He states he was not sure of the patient could take half tablet during the day but they tried it and it made her very nervous during the day.  Advised patients spouse to take medication as prescribed. Spouse wants to know if there is something Dr Tat can give her something to help with the nervousness that will take the place of Clonazepam not adding a new medication to her list. He states he knows the medication is addictive and doesn't want his wife addicted to a medication. He states the Clonazepam does help with the restless leg jumping.

## 2020-08-30 ENCOUNTER — Telehealth: Payer: Self-pay | Admitting: Neurology

## 2020-08-30 NOTE — Telephone Encounter (Signed)
Spoke with patients spouse and he has questions about Busparone. He states the rx states only one refill for a year and he is confused as to how the patient will take the medication qd  if there are not enough refills. Informed spouse that the rx is good for a year and when additional refills are needed for him to contact the pharmacy. He voiced understanding and thanked me for the clarification.

## 2020-08-30 NOTE — Telephone Encounter (Signed)
Patient husband has a question about the anxiety medication. Please call

## 2020-09-06 DIAGNOSIS — R7303 Prediabetes: Secondary | ICD-10-CM | POA: Diagnosis not present

## 2020-09-06 DIAGNOSIS — F419 Anxiety disorder, unspecified: Secondary | ICD-10-CM | POA: Diagnosis not present

## 2020-09-06 DIAGNOSIS — R413 Other amnesia: Secondary | ICD-10-CM | POA: Diagnosis not present

## 2020-09-06 DIAGNOSIS — R569 Unspecified convulsions: Secondary | ICD-10-CM | POA: Diagnosis not present

## 2020-09-06 DIAGNOSIS — Z6826 Body mass index (BMI) 26.0-26.9, adult: Secondary | ICD-10-CM | POA: Diagnosis not present

## 2020-09-06 DIAGNOSIS — N289 Disorder of kidney and ureter, unspecified: Secondary | ICD-10-CM | POA: Diagnosis not present

## 2020-09-06 DIAGNOSIS — G20 Parkinson's disease: Secondary | ICD-10-CM | POA: Diagnosis not present

## 2020-09-06 DIAGNOSIS — D539 Nutritional anemia, unspecified: Secondary | ICD-10-CM | POA: Diagnosis not present

## 2020-09-06 DIAGNOSIS — E785 Hyperlipidemia, unspecified: Secondary | ICD-10-CM | POA: Diagnosis not present

## 2020-09-06 DIAGNOSIS — E559 Vitamin D deficiency, unspecified: Secondary | ICD-10-CM | POA: Diagnosis not present

## 2020-09-06 DIAGNOSIS — I10 Essential (primary) hypertension: Secondary | ICD-10-CM | POA: Diagnosis not present

## 2020-09-06 DIAGNOSIS — Z23 Encounter for immunization: Secondary | ICD-10-CM | POA: Diagnosis not present

## 2020-09-20 DIAGNOSIS — H2512 Age-related nuclear cataract, left eye: Secondary | ICD-10-CM | POA: Diagnosis not present

## 2020-09-21 DIAGNOSIS — H2512 Age-related nuclear cataract, left eye: Secondary | ICD-10-CM | POA: Diagnosis not present

## 2020-09-24 ENCOUNTER — Other Ambulatory Visit: Payer: Self-pay | Admitting: Neurology

## 2020-09-24 NOTE — Telephone Encounter (Signed)
Rx(s) sent to pharmacy electronically.  

## 2020-12-16 ENCOUNTER — Other Ambulatory Visit: Payer: Self-pay | Admitting: Neurology

## 2020-12-16 DIAGNOSIS — G20 Parkinson's disease: Secondary | ICD-10-CM

## 2020-12-18 NOTE — Telephone Encounter (Signed)
Rx(s) sent to pharmacy electronically.  

## 2021-01-01 NOTE — Progress Notes (Signed)
Assessment/Plan:   1.  Parkinsons Disease with PDD  -Continue carbidopa/levodopa 25/100 CR, 1 tablet at 8 AM/noon/4 PM/8 PM  -Discussed importance of mental and physical exercises and exactly what that meant.  2.  PLMD  -Continue clonazepam 0.5 mg, half tablet at bedtime  3.  History of mental status change, possible seizure  -Presented to the hospital in October, 2020 with mental status change, receptive aphasia and hemianopsia.  Received TPA.  MRI was negative.  EEG with transient sharps in the left temporal region, felt to be a normal variant.  Patient was nonetheless started on Keppra, which had to be decreased because of side effects.             -We discussed doing ambulatory EEG and if negative tapering her off of the Keppra.  Ultimately, because she is doing well, patient and her husband decided to stay on the Keppra and not change anything.    4.  Generalized anxiety disorder  -Given BuSpar but did not take it.  Seems to be doing okay.  Subjective:   Belinda Davis was seen today in follow up for Parkinsons disease.  My previous records were reviewed prior to todays visit as well as outside records available to me.  Husband accompanies the patient and supplements the history.  He called me not long after last visit to state that he had the patient taking 1 full tablet instead of half a tablet of clonazepam for the prior few nights and she had actually been doing better.  I approved him to continue that, but also told him to watch out for more confusion/hangover effect/gait instability.  He states today that he did not remember asking for 1 full tablet instead of 1/2 tablet and she is only on half tablet at night.  However, he called me mid-October to state that she was anxious.  He ended up trying clonazepam during the day but ironically it made her anxiety worse.  We started her on BuSpar.  She is not taking that.  States that it causes GI upset.  Pt denies falls.  Pt denies  lightheadedness, near syncope.  No hallucinations.  She is having some confusion.  Current prescribed movement disorder medications: Clonazepam 0.5 mg, 1 tablet at bedtime (husband states that they are only taking 1/2 tablet) Carbidopa/levodopa 25/100 CR, 1 tablet at 8 AM/noon/4 PM/8 PM Keppra, 500 mg twice per day BuSpar, 5 mg daily.   ALLERGIES:  No Known Allergies  CURRENT MEDICATIONS:  Outpatient Encounter Medications as of 01/03/2021  Medication Sig  . aspirin EC 81 MG EC tablet Take 1 tablet (81 mg total) by mouth daily.  . Carbidopa-Levodopa ER (SINEMET CR) 25-100 MG tablet controlled release TAKE ONE TABLET BY MOUTH AT 8 AM, NOON,4PM AND 8PM  . Cholecalciferol (VITAMIN D-3 PO) Take 1 capsule by mouth at bedtime.  . clonazePAM (KLONOPIN) 0.5 MG tablet Take 1 tablet (0.5 mg total) by mouth at bedtime.  . hydrochlorothiazide (HYDRODIURIL) 12.5 MG tablet Take 12.5 mg by mouth daily.  Marland Kitchen levETIRAcetam (KEPPRA) 500 MG tablet Take 1 tablet by mouth twice daily  . losartan (COZAAR) 100 MG tablet Take 100 mg by mouth at bedtime.   . pravastatin (PRAVACHOL) 40 MG tablet Take 40 mg by mouth at bedtime.   . vitamin B-12 (CYANOCOBALAMIN) 1000 MCG tablet Take 1,000 mcg by mouth daily.  . [DISCONTINUED] busPIRone (BUSPAR) 5 MG tablet Take 1 tablet (5 mg total) by mouth daily. (Patient not taking: Reported  on 01/03/2021)   No facility-administered encounter medications on file as of 01/03/2021.    Objective:   PHYSICAL EXAMINATION:    VITALS:   Vitals:   01/03/21 1247  BP: (!) 124/58  Pulse: 63  SpO2: 98%  Weight: 145 lb (65.8 kg)  Height: 5\' 3"  (1.6 m)    GEN:  The patient appears stated age and is in NAD. HEENT:  Normocephalic, atraumatic.  The mucous membranes are moist. The superficial temporal arteries are without ropiness or tenderness. CV:  RRR Lungs:  CTAB Neck/HEME:  There are no carotid bruits bilaterally.  Neurological examination:  Orientation: The patient is alert  and oriented to person and place.  She does ask me about my 2 children (she was able to remember that) Cranial nerves: There is good facial symmetry with mild facial hypomimia. The speech is fluent and clear. Soft palate rises symmetrically and there is no tongue deviation. Hearing is intact to conversational tone. Sensation: Sensation is intact to light touch throughout Motor: Strength is at least antigravity x4.  Movement examination: Tone: There is normal tone in the upper and lower extremities. Abnormal movements: There is left upper extremity rest tremor, mild Coordination:  There is minimal decremation with RAM's Gait and Station: The patient pushes off to arise.  She is just mildly unsteady.  I have reviewed and interpreted the following labs independently    Chemistry      Component Value Date/Time   NA 142 10/04/2019 0855   K 4.5 10/04/2019 0855   CL 107 10/04/2019 0855   CO2 25 10/04/2019 0855   BUN 21 10/04/2019 0855   CREATININE 1.47 (H) 10/04/2019 0855      Component Value Date/Time   CALCIUM 9.5 10/04/2019 0855   ALKPHOS 58 09/13/2019 1822   AST 14 10/04/2019 0855   ALT 7 10/04/2019 0855   BILITOT 0.8 10/04/2019 0855       Lab Results  Component Value Date   WBC 5.4 10/04/2019   HGB 11.6 (L) 10/04/2019   HCT 35.2 10/04/2019   MCV 95.4 10/04/2019   PLT 199 10/04/2019    No results found for: TSH   Total time spent on today's visit was 25 minutes, including both face-to-face time and nonface-to-face time.  Time included that spent on review of records (prior notes available to me/labs/imaging if pertinent), discussing treatment and goals, answering patient's questions and coordinating care.  Cc:  Hurshel Party, NP

## 2021-01-03 ENCOUNTER — Ambulatory Visit: Payer: PPO | Admitting: Neurology

## 2021-01-03 ENCOUNTER — Other Ambulatory Visit: Payer: Self-pay

## 2021-01-03 ENCOUNTER — Encounter: Payer: Self-pay | Admitting: Neurology

## 2021-01-03 VITALS — BP 124/58 | HR 63 | Ht 63.0 in | Wt 145.0 lb

## 2021-01-03 DIAGNOSIS — G20 Parkinson's disease: Secondary | ICD-10-CM

## 2021-01-03 MED ORDER — CLONAZEPAM 0.5 MG PO TABS: 0.2500 mg | ORAL_TABLET | Freq: Every day | ORAL | 1 refills | Status: DC

## 2021-01-03 NOTE — Patient Instructions (Signed)
Online Resources for Power over Parkinson's Group February 2022  . Local [place] Online Groups  o Power over [employer] :   - Power Over Parkinson's Patient Education Group will be Wednesday, February 9th at 2pm via Zoom.   - Upcoming Power over Parkinson's Meetings:  2nd Wednesdays of the month at 2 pm:       March 9th, April 13th - Contact Amy Marriott at [email or web address] if interested in participating in this online group o Parkinson's Care Partners Group:    3rd Mondays, Contact Corwin Levins o Atypical Parkinsonian Patient Group:   4th Wednesdays, Contact Corwin Levins o If you are interested in participating in these online groups with Judson Roch, please contact her directly for how to join those meetings.  Her contact information is [email or web address].  She will send you a link to join the [employer].  (Please note that Corwin Levins , MSW, LCSW, has resigned her position at [facility], but will continue to lead the online groups temporarily)  . [place]:  [email or web address] o PD Health at Home continues:  Mindfulness Mondays, Expert Briefing Tuesdays, Wellness Wednesdays, Take Time Thursdays, Fitness Fridays -Listings for February 2022 are on the website o Upcoming Webinar:  Sights, Sounds, and Parkinson's.  Wednesday, February 2 @ 1 pm o Upcoming Webinar:  Conversations about Complementary Therapies and PD.  Wednesday, March 2 @ 1 pm o Geneticist, molecular) at [email or web address] o  Please check out their website to sign up for emails and see their full online offerings  . [place]:  [email or web address]  o Upcoming Webinar:   Moving with Mood Changes in Aging and Parkinson's:  A Look at Depression and Anxiety.  (This is a replay of a webinar originally aired June 2020)  Thursday, February 17 @ 12 noon o Check out additional information on their website to see their full online  offerings  . [place]:  [email or web address] o Upcoming Webinar:  The [place].  The Parkinson's You Don't See:  When your Autonomic System goes Off-Track.   Friday, February 18th.  Go to [email or web address], then click on Events, 10-21-1974 tab to register. o Care Partner Monthly Meetup.  With [employer] [place]in[place].  First Tuesday of each month, 2 pm o Check out additional information to Live Well Today on their website  . Parkinson and Movement Disorders (PMD) Alliance:  www.pmdalliance.org o NeuroLife Online:  Online Education Events o Sign up for emails, which are sent weekly to give you updates on programming and online offerings . Parkinson's Association of the [place]:  www.parkinsonassociation.org o Information on online support groups, education events, and online exercises including Yoga, Parkinson's exercises and more-LOTS of information on links to PD resources and online events o Virtual Support Group through Parkinson's Association of the [place]; next one is scheduled for Wednesday, January 16, 2021 at 2 pm. (These are typically scheduled for the 1st Wednesday of the month at 2 pm).  Visit website for details.  . Additional links for movement activities: o PWR! Moves Classes at [place] RESUMED (but are ON HOLD DUE TO COVID in February)  Contact Amy [place], PT [email or web address] or [phone] if interested o Here is a link to the PWR!Moves classes on Zoom from [phone] - Daily Mon-Sat at 10:00. Via Zoom, FREE and open to all.  There is also a link below via Facebook if you use that platform. - [place] - [email or web address] o  Parkinson's Wellness  Recovery (PWR! Moves)  www.[email or web address].org - Info on the PWR! Virtual Experience:  You will have access to our expertise through self-assessment, guided plans that start with the PD-specific fundamentals, educational content, tips, Q&A with an expert, and a growing Art therapist of PD-specific pre-recorded and live exercise classes of varying types and intensity - both physical and cognitive! If that is not enough, we offer 1:1 wellness consultations (in-person or virtual) to personalize your PWR! Research scientist (medical).  - Check out the PWR! Move of the month on the [place] Recovery website:  [email or web address] o [employer] Fridays:  - As part of the PD Health @ Home program, this free video series focuses each week on one aspect of fitness designed to support people living with Parkinson's.  These weekly videos highlight the [place] recent fitness guidelines for people with Parkinson's disease. -  HollywoodSale.dk o Dance for PD website is offering free, live-stream classes throughout the week, as well as links to [employer] of classes:  [email or web address]://[email or web address]/ o Dance for Parkinson's Class:  [place].  Free offering for people with Parkinson's and care partners; virtual class.  o For more information, contact [phone] or email Ruffin Frederick at [email or web address] o Virtual dance and Pilates for Parkinson's classes: Click on the Community Tab> Parkinson's Movement Initiative Tab.  To register for classes and for more information, visit www.[email or web address] and click the "community" tab.    o [facility] Parkinson's Cycling Classes  - [facility] [employer]: 1pm on Fridays-Live classes at [facility] at [email or web address] or [phone]) [phone] YMCA: Virtual Classes Mondays and Thursdays (contact  [place] at [email or web address] or [phone])   o [place] levels of classes are offered Tuesdays and Thursdays:  10:30 am,  12 noon & 1:45 pm at [facility]. To observe a class or for  more information, call [phone] or email [email or web address] - PD Flow Yoga for Parkinson's:  Fridays 1-2 pm, January 7-January 11, 2021 . Well-Spring Solutions: o 07-12-1976 Opportunities:  www.well-springsolutions.org/caregiver-education/caregiver-support-group.  You may also contact Chief Technology Officer at [email or web address] or [phone].   o Virtual Caregiver Retreat, Monday, February 21st, 3-5 pm o Powerful Tools for Caregivers, 6-wk series for caregivers, planning to be in person, starting March 10th o Well-Spring Navigator:  March 06 program, a free service to help individuals and families through the journey of determining care for older adults.  The "Navigator" is a March 23, [employer], who will speak with a prospective client and/or loved ones to provide an assessment of the situation and a set of recommendations for a personalized care plan -- all free of charge, and whether Well-Spring Solutions offers the needed service or not. If the need is not a service we provide, we are well-connected with reputable programs in town that we can refer you to.  www.well-springsolutions.org or to speak with the Navigator, call [phone]. Online Resources for Power over Parkinson's Group February 2022  . Local [place] Online Groups  o Power over [phone] Group :   - Power Over Parkinson's Patient Education Group will be Wednesday, February 9th at 2pm via Zoom.   - Upcoming Power over Parkinson's Meetings:  2nd Wednesdays of the month at 2 pm:       March 9th, April 13th - Contact Amy Marriott at [email or web address] if interested in participating in this online group o Parkinson's Care Partners  Group:    3rd Mondays, Contact 07-06-1984 o Atypical Parkinsonian Patient Group:  4th Wednesdays, Contact Corwin Levins o If you are interested in participating in these online groups with Judson Roch, please contact her directly for how to join those meetings.  Her contact information is [email or web address].  She will send you a link to join the [employer].  (Please note that Corwin Levins , MSW, LCSW, has resigned her position at [facility], but will continue to lead the online groups temporarily)  . [place]:  [email or web address] Ricker o PD Health at Home continues:  Mindfulness Mondays, Expert Briefing Tuesdays, Wellness Wednesdays, Take Time Thursdays, Fitness Fridays -Listings for February 2022 are on the website o Upcoming Webinar:  Sights, Sounds, and Parkinson's.  Wednesday, February 2 @ 1 pm o Upcoming Webinar:  Conversations about Complementary Therapies and PD.  Wednesday, March 2 @ 1 pm o Geneticist, molecular) at [email or web address] o  Please check out their website to sign up for emails and see their full online offerings  . [place]:  [email or web address]  o Upcoming Webinar:   Moving with Mood Changes in Aging and Parkinson's:  A Look at Depression and Anxiety.  (This is a replay of a webinar originally aired June 2020)  Thursday, February 17 @ 12 noon o Check out additional information on their website to see their full online offerings  . [place]:  www.[email or web address] o Upcoming Webinar:  The [place].  The Parkinson's You Don't See:  When your Autonomic System goes Off-Track.   Friday, February 18th.  Go to [email or web address], then click on Events, 10-21-1974 tab to register. o Care Partner Monthly Meetup.  With [employer].  First Tuesday of each month, 2 pm o Check out additional information to Live Well Today on  their website  . Parkinson and Movement Disorders (PMD) Alliance:  www.pmdalliance.org o NeuroLife Online:  Online Education Events o Sign up for emails, which are sent weekly to give you updates on programming and online offerings . Parkinson's Association of the [place]:  www.parkinsonassociation.org o Information on online support groups, education events, and online exercises including Yoga, Parkinson's exercises and more-LOTS of information on links to PD resources and online events o Virtual Support Group through Parkinson's Association of the [place]; next one is scheduled for Wednesday, January 16, 2021 at 2 pm. (These are typically scheduled for the 1st Wednesday of the month at 2 pm).  Visit website for details.  . Additional links for movement activities: o PWR! Moves Classes at [place] RESUMED (but are ON HOLD DUE TO COVID in February)  Contact Amy [place], PT [email or web address] or [phone] if interested o Here is a link to the PWR!Moves classes on Zoom from [phone] - Daily Mon-Sat at 10:00. Via Zoom, FREE and open to all.  There is also a link below via Facebook if you use that platform. - [place] - [email or web address]://www.[email or web address] o Parkinson's Wellness Recovery (PWR! Moves)  www.pwr4life.org - Info on the PWR! Virtual Experience:  You will have access to our expertise through self-assessment, guided plans that start with the PD-specific fundamentals, educational content, tips, Q&A with an expert, and a growing PrepaidParty.no of PD-specific pre-recorded and live exercise classes of varying types and intensity - both physical and cognitive! If that is not enough, we offer 1:1 wellness consultations (in-person or virtual) to personalize your PWR! Art therapist.  - Check out the PWR! Move of the month on the [place] Recovery website:  [address] o [email or web address]  Fitness Fridays:  - As part of the PD Health @ Home program, this free video series focuses each week on one aspect of fitness designed to support people living with Parkinson's.  These weekly videos highlight the [place] recent fitness guidelines for people with Parkinson's disease. -  HollywoodSale.dk o Dance for PD website is offering free, live-stream classes throughout the week, as well as links to [employer] of classes:  [email or web address]://[email or web address]/ o Dance for Parkinson's Class:  [place].  Free offering for people with Parkinson's and care partners; virtual class.  o For more information, contact [phone] or email Ruffin Frederick at [email or web address] o Virtual dance and Pilates for Parkinson's classes: Click on the Community Tab> Parkinson's Movement Initiative Tab.  To register for classes and for more information, visit www.[email or web address].co.za and click the "community" tab.    o [employer] Parkinson's Cycling Classes  - [facility] [employer]: 1pm on Fridays-Live classes at [facility] [employer] at [email or web address] or [phone]) [facility] [employer]: Virtual Classes Mondays and Thursdays (contact [place] at [email or web address] or [phone])   o [employer] - Three levels of classes are offered Tuesdays and Thursdays:  10:30 am,  12 noon & 1:45 pm at [facility]. To observe a class or for  more information, call [phone] or email [email or web address] - PD Flow Yoga for Parkinson's:  Fridays 1-2 pm, January 7-January 11, 2021 . Well-Spring Solutions: o Chief Technology Officer Opportunities:   www.well-springsolutions.org/caregiver-education/caregiver-support-group.  You may also contact Vickki Muff at [email or web address] or [phone].   o Virtual Caregiver Retreat, Monday, February 21st, 3-5 pm o Powerful Tools for Caregivers, 6-wk series for caregivers, planning to be in person, starting March 10th o Well-Spring Navigator:  March 23 program, a free service to help individuals and families through the journey of determining care for older adults.  The "Navigator" is a [employer], Education officer, museum, who will speak with a prospective client and/or loved ones to provide an assessment of the situation and a set of recommendations for a personalized care plan -- all free of charge, and whether Well-Spring Solutions offers the needed service or not. If the need is not a service we provide, we are well-connected with reputable programs in town that we can refer you to.  www.well-springsolutions.org or to speak with the Navigator, call [phone]. Online Resources for Power over Parkinson's Group February 2022  . Local [place] Online Groups  o Power over March 2022 Group :   - Power Over Parkinson's Patient Education Group will be Wednesday, February 9th at 2pm via Zoom.   - Upcoming Power over Parkinson's Meetings:  2nd Wednesdays of the month at 2 pm:       March 9th, April 13th - Contact Amy Marriott at [email or web address] if interested in participating in this online group o Parkinson's Care Partners Group:    3rd Mondays, Contact 05-31-1992 o Atypical Parkinsonian Patient Group:   4th Wednesdays, Contact 08-17-1994 o If you are interested in participating in these online groups with Corwin Levins, please contact her directly for how to join those meetings.  Her contact information is [email or web address].  She will send you a link to join the Judson Roch.  (Please note that [employer] , MSW, LCSW, has resigned her position at [facility], but  will continue to lead the online groups temporarily)  . [place]:  www.[email or web address] o PD Health at Home continues:  Mindfulness Mondays, Expert Briefing Tuesdays, Wellness Wednesdays, Take Time Thursdays, Fitness Fridays -Listings for February 2022  are on the website o Upcoming Webinar:  Sights, Sounds, and Parkinson's.  Wednesday, February 2 @ 1 pm o Upcoming Webinar:  Conversations about Complementary Therapies and PD.  Wednesday, March 2 @ 1 pm o Geneticist, molecular) at [email or web address] o  Please check out their website to sign up for emails and see their full online offerings  . [place]:  www.[email or web address]  o Upcoming Webinar:   Moving with Mood Changes in Aging and Parkinson's:  A Look at Depression and Anxiety.  (This is a replay of a webinar originally aired June 2020)  Thursday, February 17 @ 12 noon o Check out additional information on their website to see their full online offerings  . [place]:  www.[email or web address] o Upcoming Webinar:  The [place].  The Parkinson's You Don't See:  When your Autonomic System goes Off-Track.   Friday, February 18th.  Go to [email or web address], then click on Events, 10-[birth year]-1974 tab to register. o Care Partner Monthly Meetup.  With [employer].  First Tuesday of each month, 2 pm o Check out additional information to Live Well Today on their website  . Parkinson and Movement Disorders (PMD) Alliance:  www.pmdalliance.org o NeuroLife Online:  Online Education Events o Sign up for emails, which are sent weekly to give you updates on programming and online offerings . Parkinson's Association of the [place]:  www.parkinsonassociation.org o Information on online support groups, education events, and online exercises including Yoga, Parkinson's exercises and more-LOTS of information on links to PD  resources and online events o Virtual Support Group through Parkinson's Association of the [place]; next one is scheduled for Wednesday, January 16, 2021 at 2 pm. (These are typically scheduled for the 1st Wednesday of the month at 2 pm).  Visit website for details.  . Additional links for movement activities: o PWR! Moves Classes at [place] RESUMED (but are ON HOLD DUE TO COVID in February)  Contact Amy [place], PT [email or web address] or [phone] if interested o Here is a link to the PWR!Moves classes on Zoom from [phone] - Daily Mon-Sat at 10:00. Via Zoom, FREE and open to all.  There is also a link below via Facebook if you use that platform. - [place] - [email or web address]://www.[email or web address] o Parkinson's Wellness Recovery (PWR! Moves)  www.pwr4life.org - Info on the PWR! Virtual Experience:  You will have access to our expertise through self-assessment, guided plans that start with the PD-specific fundamentals, educational content, tips, Q&A with an expert, and a growing PrepaidParty.no of PD-specific pre-recorded and live exercise classes of varying types and intensity - both physical and cognitive! If that is not enough, we offer 1:1 wellness consultations (in-person or virtual) to personalize your PWR! Art therapist.  - Check out the PWR! Move of the month on the [employer] Recovery website:  [address] o [email or web address] Fridays:  - As part of the PD Health @ Home program, this free video series focuses each week on one aspect of fitness designed to support people living with Parkinson's.  These weekly videos highlight the [place] recent fitness guidelines for people with Parkinson's disease. -   [address] o Dance for PD website is offering free, live-stream classes throughout the week, as well as links to HollywoodSale.dk of classes:  [email or web address]://[email or web address]/ o Dance for Parkinson's Class:  [place].  Free offering for people with Parkinson's and care partners; virtual class.  o For  more information, contact [phone] or email Ruffin Frederick at [email or web address] o Virtual dance and Pilates for Parkinson's classes: Click on the Community Tab> Parkinson's Movement Initiative Tab.  To register for classes and for more information, visit www.[email or web address].co.za and click the "community" tab.    o [employer] Parkinson's Cycling Classes  - [facility] [employer]: 1pm on Fridays-Live classes at [facility] [employer] at [email or web address] or [phone]) [facility] [employer]: Virtual Classes Mondays and Thursdays (contact [place] at [email or web address] or [phone])   o [place] levels of classes are offered Tuesdays and Thursdays:  10:30 am,  12 noon & 1:45 pm at [facility]. To observe a class or for  more information, call [phone] or email [email or web address] - PD Flow Yoga for Parkinson's:  Fridays 1-2 pm, January 7-January 11, 2021 . Well-Spring Solutions: o Chief Technology Officer Opportunities:  www.well-springsolutions.org/caregiver-education/caregiver-support-group.  You may also contact Vickki Muff at [email or web address] or [phone].   o Virtual Caregiver Retreat, Monday, February 21st, 3-5 pm o Powerful Tools for Caregivers, 6-wk series for caregivers, planning to be in person, starting March 10th o Well-Spring Navigator:  March 23 program, a free service to help individuals and families through the journey of determining care for older adults.  The "Navigator" is a  [employer], Education officer, museum, who will speak with a prospective client and/or loved ones to provide an assessment of the situation and a set of recommendations for a personalized care plan -- all free of charge, and whether Well-Spring Solutions offers the needed service or not. If the need is not a service we provide, we are well-connected with reputable programs in town that we can refer you to.  www.well-springsolutions.org or to speak with the Navigator, call [phone]. Online Resources for Power over Parkinson's Group February 2022  . Local [place] Online Groups  o Power over March 2022 Group :   - Power Over Parkinson's Patient Education Group will be Wednesday, February 9th at 2pm via Zoom.   - Upcoming Power over Parkinson's Meetings:  2nd Wednesdays of the month at 2 pm:       March 9th, April 13th - Contact Amy Marriott at [email or web address] if interested in participating in this online group o Parkinson's Care Partners Group:    3rd Mondays, Contact 05-31-1992 o Atypical Parkinsonian Patient Group:   4th Wednesdays, Contact 08-17-1994 o If you are interested in participating in these online groups with Corwin Levins, please contact her directly for how to join those meetings.  Her contact information is [email or web address].  She will send you a link to join the Judson Roch.  (Please note that [employer] , MSW, LCSW, has resigned her position at [facility], but will continue to lead the online groups temporarily)  . [place]:  www.[email or web address].[address] o PD Health at Home continues:  Mindfulness Mondays, Expert Briefing Tuesdays, Wellness Wednesdays, Take Time Thursdays, Fitness Fridays -Listings for February 2022 are on the website o Upcoming Webinar:  Sights, Sounds, and Parkinson's.  Wednesday, February 2 @ 1 pm o Upcoming Webinar:  Conversations about Complementary Therapies and PD.  Wednesday, March 2 @ 1 pm o 01-29-2002) at [email or web address] o  Please check out their website to sign up for emails and see their full online offerings  . [place]:  www.[email or web address]  o Upcoming Webinar:   Moving with Mood Changes in Aging and Parkinson's:  A Look at Depression and Anxiety.  (This is a replay of a webinar originally  aired June 2020)  Thursday, February 17 @ 12 noon o Check out additional information on their website to see their full online offerings  . [place]:  www.[email or web address] o Upcoming Webinar:  The [employer].  The Parkinson's You Don't See:  When your Autonomic System goes Off-Track.   Friday, February 18th.  Go to [email or web address], then click on Events, [employer] tab to register. o Care Partner Monthly Meetup.  With Robin Searing Phinney.  First Tuesday of each month, 2 pm o Check out additional information to Live Well Today on their website  . Parkinson and Movement Disorders (PMD) Alliance:  www.pmdalliance.org o NeuroLife Online:  Online Education Events o Sign up for emails, which are sent weekly to give you updates on programming and online offerings . Parkinson's Association of the [place]:  www.parkinsonassociation.org o Information on online support groups, education events, and online exercises including Yoga, Parkinson's exercises and more-LOTS of information on links to PD resources and online events o Virtual Support Group through Parkinson's Association of the [place]; next one is scheduled for Wednesday, January 16, 2021 at 2 pm. (These are typically scheduled for the 1st Wednesday of the month at 2 pm).  Visit website for details.  . Additional links for movement activities: o PWR! Moves Classes at [place] RESUMED (but are ON HOLD DUE TO COVID in February)  Contact Amy [place], PT [email or web address] or [phone] if  interested o Here is a link to the PWR!Moves classes on Zoom from [place] - Daily Mon-Sat at 10:00. Via Zoom, FREE and open to all.  There is also a link below via Facebook if you use that platform. - AptDealers.si - [email or web address] o Parkinson's Wellness Recovery (PWR! Moves)  www.[email or web address].org - Info on the PWR! Virtual Experience:  You will have access to our expertise through self-assessment, guided plans that start with the PD-specific fundamentals, educational content, tips, Q&A with an expert, and a growing Art therapist of PD-specific pre-recorded and live exercise classes of varying types and intensity - both physical and cognitive! If that is not enough, we offer 1:1 wellness consultations (in-person or virtual) to personalize your PWR! Research scientist (medical).  - Check out the PWR! Move of the month on the [place] Recovery website:  [email or web address].[email or web address]-[email or web address] o [employer] Fridays:  - As part of the PD Health @ Home program, this free video series focuses each week on one aspect of fitness designed to support people living with Parkinson's.  These weekly videos highlight the [place] recent fitness guidelines for people with Parkinson's disease. -  HollywoodSale.dk o Dance for PD website is offering free, live-stream classes throughout the week, as well as links to [employer] of classes:  [email or web address]://[email or web address]/ o Dance for Parkinson's Class:  [place].  Free offering for people with Parkinson's and care partners; virtual class.  o For more information, contact [phone] or email Ruffin Frederick at  [email or web address] o Virtual dance and Pilates for Parkinson's classes: Click on the Community Tab> Parkinson's Movement Initiative Tab.  To register for classes and for more information, visit www.[email or web address] and click the "community" tab.    o [facility] Parkinson's Cycling Classes  - [facility] [employer]: 1pm on Fridays-Live classes at [facility] [employer] at [email or web address] or [phone]) [facility] [employer]: Virtual Classes Mondays and Thursdays (contact [place] at [email or web address] or [phone])   o [facility] Boxing - Three levels of classes are offered Tuesdays and Thursdays:  10:30 am,  12 noon & 1:45 pm at [employer]. To observe a class or for  more information, call [phone] or email [email or web address] - PD Flow Yoga for Parkinson's:  Fridays 1-2 pm, January 7-January 11, 2021 . Well-Spring Solutions: o Chief Technology Officer Opportunities:  www.well-springsolutions.org/caregiver-education/caregiver-support-group.  You may also contact Vickki Muff at [email or web address] or [phone].   o Virtual Caregiver Retreat, Monday, February 21st, 3-5 pm o Powerful Tools for Caregivers, 6-wk series for caregivers, planning to be in person, starting March 10th o Well-Spring Navigator:  March 23 program, a free service to help individuals and families through the journey of determining care for older adults.  The "Navigator" is a [employer], Education officer, museum, who will speak with a prospective client and/or loved ones to provide an assessment of the situation and a set of recommendations for a personalized care plan -- all free of charge, and whether Well-Spring Solutions offers the needed service or not. If the need is not a service we provide, we are well-connected with reputable programs in town that we can refer you to.  www.well-springsolutions.org or to speak with the Navigator,  call [phone]. Online Resources for Power over Parkinson's Group February 2022  . Local [place] Online Groups  o Power over March 2022 Group :   - Power Over Parkinson's Patient Education Group will be Wednesday, February 9th at 2pm via Zoom.   - Upcoming Power over Parkinson's Meetings:  2nd Wednesdays of the month at 2 pm:       March 9th, April 13th - Contact Amy Marriott at [email or web address] if interested in participating in this online group o Parkinson's Care Partners Group:    3rd Mondays, Contact 05-31-1992 o Atypical Parkinsonian Patient Group:   4th Wednesdays, Contact 08-17-1994 o If you are interested in participating in these online groups with Corwin Levins, please contact her directly for how to join those meetings.  Her contact information is [email or web address].  She will send you a link to join the Judson Roch.  (Please note that [employer] , MSW, LCSW, has resigned her position at [facility], but will continue to lead the online groups temporarily)  . [place]:  [email or web address] o PD Health at Home continues:  Mindfulness Mondays, Expert Briefing Tuesdays, Wellness Wednesdays, Take Time Thursdays, Fitness Fridays -Listings for February 2022 are on the website o Upcoming Webinar:  Sights, Sounds, and Parkinson's.  Wednesday, February 2 @ 1 pm o Upcoming Webinar:  Conversations about Complementary Therapies and PD.  Wednesday, March 2 @ 1 pm o 01-29-2002) at [email or web address] o  Please check out their website to sign up for emails and see their full online offerings  . [place]:  [email or web address]  o Upcoming Webinar:   Moving with Mood Changes in Aging and Parkinson's:  A Look at Depression and Anxiety.  (This is a replay of a webinar originally aired June 2020)  Thursday, February 17 @ 12 noon o Check out additional information on their website to see their full  online offerings  . [place]:  www.[email or web address] o Upcoming Webinar:  The 03-20-2007.  The Parkinson's You Don't See:  When your Autonomic System goes Off-Track.   Friday, February 18th.  Go to [email or web address], then click on Events, [employer] tab to register. o Care Partner Monthly Meetup.  With 10-21-1974 [place]ney.  First Tuesday of each month, 2 pm o Check out additional information to Live Well Today  on their website  . Parkinson and Movement Disorders (PMD) Alliance:  www.pmdalliance.org o NeuroLife Online:  Online Education Events o Sign up for emails, which are sent weekly to give you updates on programming and online offerings . Parkinson's Association of the [place]:  www.parkinsonassociation.org o Information on online support groups, education events, and online exercises including Yoga, Parkinson's exercises and more-LOTS of information on links to PD resources and online events o Virtual Support Group through Parkinson's Association of the [place]; next one is scheduled for Wednesday, January 16, 2021 at 2 pm. (These are typically scheduled for the 1st Wednesday of the month at 2 pm).  Visit website for details.  . Additional links for movement activities: o PWR! Moves Classes at [place] RESUMED (but are ON HOLD DUE TO COVID in February)  Contact Amy Shu[place]ak, PT [email or web address] or [phone] if interested o Here is a link to the PWR!Moves classes on Zoom from [place] - Daily Mon-Sat at 10:00. Via Zoom, FREE and open to all.  There is also a link below via Facebook if you use that platform. - [email or web address].si - [email or web address]://[email or web address] o Parkinson's  Wellness Recovery (PWR! Moves)  www.[email or web address].org - Info on the PWR! Virtual Experience:  You will have access to our expertise through self-assessment, guided plans that start with the PD-specific fundamentals, educational content, tips, Q&A with an expert, and a growing Art therapist of PD-specific pre-recorded and live exercise classes of varying types and intensity - both physical and cognitive! If that is not enough, we offer 1:1 wellness consultations (in-person or virtual) to personalize your PWR! Research scientist (medical).  - Check out the PWR! Move of the month on the [place] Recovery website:  [email or web address]://www.[email or web address] o [employer] Fridays:  - As part of the PD Health @ Home program, this free video series focuses each week on one aspect of fitness designed to support people living with Parkinson's.  These weekly videos highlight the [place] recent fitness guidelines for people with Parkinson's disease. -  HollywoodSale.dk o Dance for PD website is offering free, live-stream classes throughout the week, as well as links to [employer] of classes:  [email or web address]://[email or web address]/ o Dance for Parkinson's Class:  [place].  Free offering for people with Parkinson's and care partners; virtual class.  o For more information, contact [phone] or email Ruffin Frederick at [email or web address] o Virtual dance and Pilates for Parkinson's classes: Click on the Community Tab> Parkinson's Movement Initiative Tab.  To register for classes and for more information, visit www.[email or web address] and click the "community" tab.    o [facility] Parkinson's Cycling Classes  - [facility] [employer]: 1pm on Fridays-Live classes at [facility] [employer] at [email or web address] or [phone]) [facility] [employer]: Virtual Classes Mondays and Thursdays  (contact [place] at [email or web address] or [phone])   o [place] levels of classes are offered Tuesdays and Thursdays:  10:30 am,  12 noon & 1:45 pm at [facility]. To observe a class or for  more information, call [phone] or email [email or web address] - PD Flow Yoga for Parkinson's:  Fridays 1-2 pm, January 7-January 11, 2021 . Well-Spring Solutions: o Chief Technology Officer Opportunities:  www.well-springsolutions.org/caregiver-education/caregiver-support-group.  You may also contact Vickki Muff at [email or web address] or [phone].   o Virtual Caregiver Retreat, Monday, February 21st, 3-5 pm o Powerful Tools for Caregivers, 6-wk series for caregivers, planning to be in person, starting March 10th o Well-Spring Navigator:  March 23 program, a free service to help  individuals and families through the journey of determining care for older adults.  The "Navigator" is a Education officer, museum, Arnell Asal, who will speak with a prospective client and/or loved ones to provide an assessment of the situation and a set of recommendations for a personalized care plan -- all free of charge, and whether Well-Spring Solutions offers the needed service or not. If the need is not a service we provide, we are well-connected with reputable programs in town that we can refer you to.  www.well-springsolutions.org or to speak with the Navigator, call [phone]. Online Resources for Power over Parkinson's Group February 2022  . Local [place] Online Groups  o Power over [employer] Group :   - Power Over Parkinson's Patient Education Group will be Wednesday, February 9th at 2pm via Zoom.   - Upcoming Power over Parkinson's Meetings:  2nd Wednesdays of the month at 2 pm:       March 9th, April 13th - Contact Amy Marriott at [email or web address] if interested in participating in this online group o Parkinson's Care  Partners Group:    3rd Mondays, Contact Corwin Levins o Atypical Parkinsonian Patient Group:   4th Wednesdays, Contact Corwin Levins o If you are interested in participating in these online groups with Judson Roch, please contact her directly for how to join those meetings.  Her contact information is [email or web address].  She will send you a link to join the [employer].  (Please note that Corwin Levins , MSW, LCSW, has resigned her position at [facility], but will continue to lead the online groups temporarily)  . [place]:  [email or web address] o PD Health at Home continues:  Mindfulness Mondays, Expert Briefing Tuesdays, Wellness Wednesdays, Take Time Thursdays, Fitness Fridays -Listings for February 2022 are on the website o Upcoming Webinar:  Sights, Sounds, and Parkinson's.  Wednesday, February 2 @ 1 pm o Upcoming Webinar:  Conversations about Complementary Therapies and PD.  Wednesday, March 2 @ 1 pm o Geneticist, molecular) at [email or web address] o  Please check out their website to sign up for emails and see their full online offerings  . [place]:  www.[email or web address]  o Upcoming Webinar:   Moving with Mood Changes in Aging and Parkinson's:  A Look at Depression and Anxiety.  (This is a replay of a webinar originally aired June 2020)  Thursday, February 17 @ 12 noon o Check out additional information on their website to see their full online offerings  . [place]:  www.[email or web address] o Upcoming Webinar:  The [place].  The Parkinson's You Don't See:  When your Autonomic System goes Off-Track.   Friday, February 18th.  Go to [email or web address], then click on Events, 10-21-1974 tab to register. o Care Partner Monthly Meetup.  With [employer] Phinney.  First Tuesday of each month, 2 pm o Check out additional information to Live Well  Today on their website  . Parkinson and Movement Disorders (PMD) Alliance:  www.pmdalliance.org o NeuroLife Online:  Online Education Events o Sign up for emails, which are sent weekly to give you updates on programming and online offerings . Parkinson's Association of the [place]:  www.parkinsonassociation.org o Information on online support groups, education events, and online exercises including Yoga, Parkinson's exercises and more-LOTS of information on links to PD resources and online events o Virtual Support Group through Parkinson's Association of the [place]; next one is scheduled for Wednesday, January 16, 2021 at 2 pm. (These are typically scheduled for  the 1st Wednesday of the month at 2 pm).  Visit website for details.  . Additional links for movement activities: o PWR! Moves Classes at [place] RESUMED (but are ON HOLD DUE TO COVID in February)  Contact Amy [place]der[place], PT [email or web address] or [phone] if interested o Here is a link to the PWR!Moves classes on Zoom from [place] - Daily Mon-Sat at 10:00. Via Zoom, FREE and open to all.  There is also a link below via Facebook if you use that platform. - [email or web address].si - https://www.PrepaidParty.no o Parkinson's Wellness Recovery (PWR! Moves)  www.pwr4life.org - Info on the PWR! Virtual Experience:  You will have access to our expertise through self-assessment, guided plans that start with the PD-specific fundamentals, educational content, tips, Q&A with an expert, and a growing Art therapist of PD-specific pre-recorded and live exercise classes of varying types and intensity - both physical and cognitive! If that is not enough, we offer 1:1 wellness consultations (in-person or virtual) to personalize your PWR!  Research scientist (medical).  - Check out the PWR! Move of the month on the [place] Recovery website:  [email or web address]://www.[email or web address] o [employer] Fridays:  - As part of the PD Health @ Home program, this free video series focuses each week on one aspect of fitness designed to support people living with Parkinson's.  These weekly videos highlight the [place] recent fitness guidelines for people with Parkinson's disease. -  HollywoodSale.dk o Dance for PD website is offering free, live-stream classes throughout the week, as well as links to [employer] of classes:  https://[email or web address]/ o Dance for Parkinson's Class:  [place].  Free offering for people with Parkinson's and care partners; virtual class.  o For more information, contact [phone] or email Ruffin Frederick at [email or web address] o Virtual dance and Pilates for Parkinson's classes: Click on the Community Tab> Parkinson's Movement Initiative Tab.  To register for classes and for more information, visit www.[email or web address] and click the "community" tab.    o [facility] Parkinson's Cycling Classes  - [facility] [employer]: 1pm on Fridays-Live classes at [facility] [employer] at [email or web address] or [phone]) [facility] [employer]: Virtual Classes Mondays and Thursdays (contact [place] at [email or web address] or [phone])   o [place] levels of classes are offered Tuesdays and Thursdays:  10:30 am,  12 noon & 1:45 pm at [facility]. To observe a class or for  more information, call [phone] or email [email or web address] - PD Flow Yoga for Parkinson's:  Fridays 1-2 pm, January 7-January 11, 2021 . Well-Spring Solutions: o Chief Technology Officer Opportunities:   www.well-springsolutions.org/caregiver-education/caregiver-support-group.  You may also contact Vickki Muff at [email or web address] or [phone].   o Virtual Caregiver Retreat, Monday, February 21st, 3-5 pm o Powerful Tools for Caregivers, 6-wk series for caregivers, planning to be in person, starting March 10th o Well-Spring Navigator:  March 23 program, a free service to help individuals and families through the journey of determining care for older adults.  The "Navigator" is a [employer], Education officer, museum, who will speak with a prospective client and/or loved ones to provide an assessment of the situation and a set of recommendations for a personalized care plan -- all free of charge, and whether Well-Spring Solutions offers the needed service or not. If the need is not a service we provide, we are well-connected with reputable programs in town that we can refer you to.  www.well-springsolutions.org or to speak with the Navigator, call [phone]. Online Resources for Power  over Parkinson's Group February 2022  . Local [place] Online Groups  o Power over [employer] Group :   - Power Over Parkinson's Patient Education Group will be Wednesday, February 9th at 2pm via Zoom.   - Upcoming Power over Parkinson's Meetings:  2nd Wednesdays of the month at 2 pm:       March 9th, April 13th - Contact Amy Marriott at [email or web address] if interested in participating in this online group o Parkinson's Care Partners Group:    3rd Mondays, Contact Corwin Levins o Atypical Parkinsonian Patient Group:   4th Wednesdays, Contact Corwin Levins o If you are interested in participating in these online groups with Judson Roch, please contact her directly for how to join those meetings.  Her contact information is [email or web address].  She will send you a link to join the [employer].  (Please note that Corwin Levins , MSW, LCSW, has resigned her position at [facility], but  will continue to lead the online groups temporarily)  . [place]:  [email or web address] o PD Health at Home continues:  Mindfulness Mondays, Expert Briefing Tuesdays, Wellness Wednesdays, Take Time Thursdays, Fitness Fridays -Listings for February 2022 are on the website o Upcoming Webinar:  Sights, Sounds, and Parkinson's.  Wednesday, February 2 @ 1 pm o Upcoming Webinar:  Conversations about Complementary Therapies and PD.  Wednesday, March 2 @ 1 pm o Geneticist, molecular) at [email or web address] o  Please check out their website to sign up for emails and see their full online offerings  . [place]:  www.[email or web address]  o Upcoming Webinar:   Moving with Mood Changes in Aging and Parkinson's:  A Look at Depression and Anxiety.  (This is a replay of a webinar originally aired June 2020)  Thursday, February 17 @ 12 noon o Check out additional information on their website to see their full online offerings  . [place]:  www.[email or web address] o Upcoming Webinar:  The [place].  The Parkinson's You Don't See:  When your Autonomic System goes Off-Track.   Friday, February 18th.  Go to [email or web address], then click on Events, 10-21-1974 tab to register. o Care Partner Monthly Meetup.  With [employer] [place].  First Tuesday of each month, 2 pm o Check out additional information to Live Well Today on their website  . Parkinson and Movement Disorders (PMD) Alliance:  www.pmdalliance.org o NeuroLife Online:  Online Education Events o Sign up for emails, which are sent weekly to give you updates on programming and online offerings . Parkinson's Association of the [place]:  www.parkinsonassociation.org o Information on online support groups, education events, and online exercises including Yoga, Parkinson's exercises and more-LOTS of information on links to PD  resources and online events o Virtual Support Group through Parkinson's Association of the [place]; next one is scheduled for Wednesday, January 16, 2021 at 2 pm. (These are typically scheduled for the 1st Wednesday of the month at 2 pm).  Visit website for details.  . Additional links for movement activities: o PWR! Moves Classes at [place] RESUMED (but are ON HOLD DUE TO COVID in February)  Contact Amy [place], PT [email or web address] or [phone] if interested o Here is a link to the PWR!Moves classes on Zoom from [phone] - Daily Mon-Sat at 10:00. Via Zoom, FREE and open to all.  There is also a link below via Facebook if you use that platform. - [place] - https://www.[email or web address] o Parkinson's Wellness Recovery (  PWR! Moves)  www.pwr4life.org - Info on the PWR! Virtual Experience:  You will have access to our expertise through self-assessment, guided plans that start with the PD-specific fundamentals, educational content, tips, Q&A with an expert, and a growing Art therapist of PD-specific pre-recorded and live exercise classes of varying types and intensity - both physical and cognitive! If that is not enough, we offer 1:1 wellness consultations (in-person or virtual) to personalize your PWR! Research scientist (medical).  - Check out the PWR! Move of the month on the [place] Recovery website:  https://www.[email or web address]/ o [employer] Fridays:  - As part of the PD Health @ Home program, this free video series focuses each week on one aspect of fitness designed to support people living with Parkinson's.  These weekly videos highlight the [place] recent fitness guidelines for people with Parkinson's disease. -   HollywoodSale.dk o Dance for PD website is offering free, live-stream classes throughout the week, as well as links to [employer] of classes:  [email or web address]://[email or web address]/ o Dance for Parkinson's Class:  [place].  Free offering for people with Parkinson's and care partners; virtual class.  o For more information, contact [phone] or email Ruffin Frederick at [email or web address] o Virtual dance and Pilates for Parkinson's classes: Click on the Community Tab> Parkinson's Movement Initiative Tab.  To register for classes and for more information, visit www.[email or web address].za and click the "community" tab.    o [facility] Parkinson's Cycling Classes  - [facility] [employer]: 1pm on Fridays-Live classes at [facility] [employer] at [email or web address] or [phone]) [facility] [employer] [employer]: Virtual Classes Mondays and Thursdays (contact [place] at [email or web address] or [phone])   o [facility] Boxing - Three levels of classes are offered Tuesdays and Thursdays:  10:30 am,  12 noon & 1:45 pm at [facility]. To observe a class or for  more information, call [phone] or email [email or web address] - PD Flow Yoga for Parkinson's:  Fridays 1-2 pm, January 7-January 11, 2021 . Well-Spring Solutions: o Chief Technology Officer Opportunities:  www.well-springsolutions.org/caregiver-education/caregiver-support-group.  You may also contact Vickki Muff at [email or web address] or [phone].   o Virtual Caregiver Retreat, Monday, February 21st, 3-5 pm o Powerful Tools for Caregivers, 6-wk series for caregivers, planning to be in person, starting March 10th o Well-Spring Navigator:  March 23 program, a free service to help individuals and families through the journey of determining care for older adults.  The "Navigator" is a  [employer], Education officer, museum, who will speak with a prospective client and/or loved ones to provide an assessment of the situation and a set of recommendations for a personalized care plan -- all free of charge, and whether Well-Spring Solutions offers the needed service or not. If the need is not a service we provide, we are well-connected with reputable programs in town that we can refer you to.  www.well-springsolutions.org or to speak with the Navigator, call [phone].

## 2021-01-07 ENCOUNTER — Telehealth: Payer: Self-pay | Admitting: Neurology

## 2021-01-07 NOTE — Telephone Encounter (Signed)
Patient called and left a message requesting a call back from a nurse. He said he has questions about his wife's medicine.

## 2021-01-07 NOTE — Telephone Encounter (Signed)
No answer at 136 01/07/2021.

## 2021-01-08 DIAGNOSIS — N289 Disorder of kidney and ureter, unspecified: Secondary | ICD-10-CM | POA: Diagnosis not present

## 2021-01-08 DIAGNOSIS — G20 Parkinson's disease: Secondary | ICD-10-CM | POA: Diagnosis not present

## 2021-01-08 DIAGNOSIS — F419 Anxiety disorder, unspecified: Secondary | ICD-10-CM | POA: Diagnosis not present

## 2021-01-08 DIAGNOSIS — E559 Vitamin D deficiency, unspecified: Secondary | ICD-10-CM | POA: Diagnosis not present

## 2021-01-08 DIAGNOSIS — R7303 Prediabetes: Secondary | ICD-10-CM | POA: Diagnosis not present

## 2021-01-08 DIAGNOSIS — R413 Other amnesia: Secondary | ICD-10-CM | POA: Diagnosis not present

## 2021-01-08 DIAGNOSIS — M810 Age-related osteoporosis without current pathological fracture: Secondary | ICD-10-CM | POA: Diagnosis not present

## 2021-01-08 DIAGNOSIS — I10 Essential (primary) hypertension: Secondary | ICD-10-CM | POA: Diagnosis not present

## 2021-01-08 DIAGNOSIS — D539 Nutritional anemia, unspecified: Secondary | ICD-10-CM | POA: Diagnosis not present

## 2021-01-08 DIAGNOSIS — Z6827 Body mass index (BMI) 27.0-27.9, adult: Secondary | ICD-10-CM | POA: Diagnosis not present

## 2021-01-08 DIAGNOSIS — R569 Unspecified convulsions: Secondary | ICD-10-CM | POA: Diagnosis not present

## 2021-01-08 DIAGNOSIS — E785 Hyperlipidemia, unspecified: Secondary | ICD-10-CM | POA: Diagnosis not present

## 2021-01-08 NOTE — Telephone Encounter (Signed)
Spoke with patient who wants to know if she can take a whole tablet of her Klonopin to help her sleep at night? She states taking a half a tablet she wakes up at four am and thinks if she takes a whole tab she will be able to sleep longer.   Advised her that I would speak with Dr Tat and get back to her she voiced understanding.

## 2021-01-08 NOTE — Telephone Encounter (Signed)
Patient notified and voiced understanding.

## 2021-01-08 NOTE — Telephone Encounter (Signed)
I did that previously and she came back on 1/2 tablet.  I have no objection so long as she doesn't have hang over effect.  She can take 1/2-1 tablet as needed.

## 2021-03-13 ENCOUNTER — Other Ambulatory Visit: Payer: Self-pay | Admitting: Neurology

## 2021-03-13 DIAGNOSIS — G20 Parkinson's disease: Secondary | ICD-10-CM

## 2021-03-27 ENCOUNTER — Other Ambulatory Visit: Payer: Self-pay | Admitting: Neurology

## 2021-03-28 ENCOUNTER — Other Ambulatory Visit: Payer: Self-pay | Admitting: Neurology

## 2021-03-28 DIAGNOSIS — I10 Essential (primary) hypertension: Secondary | ICD-10-CM | POA: Diagnosis not present

## 2021-03-28 DIAGNOSIS — E538 Deficiency of other specified B group vitamins: Secondary | ICD-10-CM | POA: Diagnosis not present

## 2021-03-28 DIAGNOSIS — E559 Vitamin D deficiency, unspecified: Secondary | ICD-10-CM | POA: Diagnosis not present

## 2021-03-28 DIAGNOSIS — R5381 Other malaise: Secondary | ICD-10-CM | POA: Diagnosis not present

## 2021-03-28 DIAGNOSIS — R7303 Prediabetes: Secondary | ICD-10-CM | POA: Diagnosis not present

## 2021-03-28 DIAGNOSIS — R569 Unspecified convulsions: Secondary | ICD-10-CM | POA: Diagnosis not present

## 2021-03-28 DIAGNOSIS — D539 Nutritional anemia, unspecified: Secondary | ICD-10-CM | POA: Diagnosis not present

## 2021-03-28 DIAGNOSIS — Z6826 Body mass index (BMI) 26.0-26.9, adult: Secondary | ICD-10-CM | POA: Diagnosis not present

## 2021-03-28 DIAGNOSIS — N289 Disorder of kidney and ureter, unspecified: Secondary | ICD-10-CM | POA: Diagnosis not present

## 2021-03-28 DIAGNOSIS — R413 Other amnesia: Secondary | ICD-10-CM | POA: Diagnosis not present

## 2021-03-28 DIAGNOSIS — R5383 Other fatigue: Secondary | ICD-10-CM | POA: Diagnosis not present

## 2021-03-28 DIAGNOSIS — E785 Hyperlipidemia, unspecified: Secondary | ICD-10-CM | POA: Diagnosis not present

## 2021-03-28 DIAGNOSIS — G20 Parkinson's disease: Secondary | ICD-10-CM | POA: Diagnosis not present

## 2021-03-29 ENCOUNTER — Telehealth: Payer: Self-pay | Admitting: Neurology

## 2021-03-29 NOTE — Telephone Encounter (Signed)
Patient's spouse Clair Gulling called requesting a call back. She saw her primary care provider, Amy Moon, yesterday who recommended the patient start taking the buspirone 5 MG again to help her get through the day better.  Dr. Carles Collet previously prescribed this and he is asking for he to prescribe it again.  Patient has some left over from when she was taking it before but she will need refills sent in.  [facility] in [place]

## 2021-04-01 ENCOUNTER — Other Ambulatory Visit: Payer: Self-pay | Admitting: Neurology

## 2021-04-01 MED ORDER — BUSPIRONE HCL 5 MG PO TABS: 5.0000 mg | ORAL_TABLET | Freq: Every day | ORAL | 1 refills | Status: DC

## 2021-04-01 NOTE — Telephone Encounter (Signed)
While I think its strange that her primary NP cannot give her this (not a neurologic med), I have no objection to RX it.  I did before but she never took it.  5 mg daily.  You can send to pharmacy.  #90 with 1 RF

## 2021-04-01 NOTE — Telephone Encounter (Signed)
Script sent in for pt. 

## 2021-04-29 DIAGNOSIS — E538 Deficiency of other specified B group vitamins: Secondary | ICD-10-CM | POA: Diagnosis not present

## 2021-04-29 DIAGNOSIS — R5383 Other fatigue: Secondary | ICD-10-CM | POA: Diagnosis not present

## 2021-04-29 DIAGNOSIS — R5381 Other malaise: Secondary | ICD-10-CM | POA: Diagnosis not present

## 2021-04-29 DIAGNOSIS — Z6826 Body mass index (BMI) 26.0-26.9, adult: Secondary | ICD-10-CM | POA: Diagnosis not present

## 2021-06-05 DIAGNOSIS — Z9181 History of falling: Secondary | ICD-10-CM | POA: Diagnosis not present

## 2021-06-05 DIAGNOSIS — Z139 Encounter for screening, unspecified: Secondary | ICD-10-CM | POA: Diagnosis not present

## 2021-06-05 DIAGNOSIS — E785 Hyperlipidemia, unspecified: Secondary | ICD-10-CM | POA: Diagnosis not present

## 2021-06-05 DIAGNOSIS — Z0000 Encounter for general adult medical examination without abnormal findings: Secondary | ICD-10-CM | POA: Diagnosis not present

## 2021-06-05 DIAGNOSIS — Z1331 Encounter for screening for depression: Secondary | ICD-10-CM | POA: Diagnosis not present

## 2021-06-12 ENCOUNTER — Other Ambulatory Visit: Payer: Self-pay | Admitting: Neurology

## 2021-06-12 DIAGNOSIS — G20 Parkinson's disease: Secondary | ICD-10-CM

## 2021-06-18 ENCOUNTER — Other Ambulatory Visit: Payer: Self-pay | Admitting: Neurology

## 2021-06-25 DIAGNOSIS — R5381 Other malaise: Secondary | ICD-10-CM | POA: Diagnosis not present

## 2021-06-25 DIAGNOSIS — E538 Deficiency of other specified B group vitamins: Secondary | ICD-10-CM | POA: Diagnosis not present

## 2021-06-25 DIAGNOSIS — I10 Essential (primary) hypertension: Secondary | ICD-10-CM | POA: Diagnosis not present

## 2021-06-25 DIAGNOSIS — R413 Other amnesia: Secondary | ICD-10-CM | POA: Diagnosis not present

## 2021-06-25 DIAGNOSIS — N289 Disorder of kidney and ureter, unspecified: Secondary | ICD-10-CM | POA: Diagnosis not present

## 2021-06-25 DIAGNOSIS — E785 Hyperlipidemia, unspecified: Secondary | ICD-10-CM | POA: Diagnosis not present

## 2021-06-25 DIAGNOSIS — R5383 Other fatigue: Secondary | ICD-10-CM | POA: Diagnosis not present

## 2021-06-25 DIAGNOSIS — Z6826 Body mass index (BMI) 26.0-26.9, adult: Secondary | ICD-10-CM | POA: Diagnosis not present

## 2021-06-25 DIAGNOSIS — D539 Nutritional anemia, unspecified: Secondary | ICD-10-CM | POA: Diagnosis not present

## 2021-06-25 DIAGNOSIS — E559 Vitamin D deficiency, unspecified: Secondary | ICD-10-CM | POA: Diagnosis not present

## 2021-06-25 DIAGNOSIS — R569 Unspecified convulsions: Secondary | ICD-10-CM | POA: Diagnosis not present

## 2021-06-25 DIAGNOSIS — G20 Parkinson's disease: Secondary | ICD-10-CM | POA: Diagnosis not present

## 2021-06-25 DIAGNOSIS — R7303 Prediabetes: Secondary | ICD-10-CM | POA: Diagnosis not present

## 2021-07-08 NOTE — Progress Notes (Signed)
Assessment/Plan:   1.  Parkinsons Disease with PDD  -Continue carbidopa/levodopa 25/100 CR, 1 tablet at 8 AM/noon/4 PM/8 PM  -Discussed importance of mental and physical exercises and exactly what that meant.  2.  PLMD  -Continue clonazepam 0.5 mg, half tablet at bedtime.  Several times she has tried to go up to 1 tablet at bedtime, but ultimately ended up coming back down to half a tablet because of hangover effect.  -Daughter had patient asked about gabapentin.  I do not think that this would be a good idea in her due to cognitive change without medication.  In addition, I do not think she really has restless leg (which is how it was described), but rather PLMD and gabapentin will not help that.  3.  History of mental status change, possible seizure  -Presented to the hospital in October, 2020 with mental status change, receptive aphasia and hemianopsia.  Received TPA.  MRI was negative.  EEG with transient sharps in the left temporal region, felt to be a normal variant.  Patient was nonetheless started on Keppra, which had to be decreased because of side effects.             -Remain concerned that [place] is contributing to the mood change.  Patient has had no further spells since October, 2020.  We are going to try to wean this off.  Patient and husband agreeable.  Will be a slow wean, over the next 8 weeks.  -If above not helpful, we may consider quetiapine, both for sleep and agitation.   Subjective:   Belinda Figuero was seen today in follow up for Parkinsons disease.  My previous records were reviewed prior to todays visit as well as outside records available to me.  Husband accompanies the patient and supplements the history.  He called me not long after last visit to state that he had the patient taking 1 full tablet instead of half a tablet of clonazepam for the prior few nights and she had actually been doing better.  I approved him to continue that, but also told him to watch  out for more confusion/hangover effect/gait instability.  This actually happened at the previous visit as well, and then they ended up going back down on the clonazepam.  I also called me back and asked about going back on the BuSpar.  We restarted that.  However, pt states it made her feel "loopy" and she stopped it.  Only took 1 or 2 pills.  Husband reports that she has "daytime moods" - gets agitated.  She sleeps about 2-3 hours sleep when first lays down but then doesn't sleep much thereafter.  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 State today that she went back to 1/2 tab due to hangover effect.   Carbidopa/levodopa 25/100 CR, 1 tablet at 8 AM/noon/4 PM/8 PM Keppra, 500 mg twice per day BuSpar, 5 mg daily.  Prior meds:  buspar, '5mg'$  (felt loopy but only took 1 or 2 tablets); Keppra  ALLERGIES:  No Known Allergies  CURRENT MEDICATIONS:  Outpatient Encounter Medications as of 07/09/2021  Medication Sig   aspirin EC 81 MG EC tablet Take 1 tablet (81 mg total) by mouth  daily.   Carbidopa-Levodopa ER (SINEMET CR) 25-100 MG tablet controlled release TAKE 1 TABLET BY MOUTH AT 8AM, NOON, 4PM AND 8PM   Cholecalciferol (VITAMIN D-3 PO) Take 1 capsule by mouth at bedtime.   clonazePAM (KLONOPIN) 0.5 MG tablet Take 0.5 tablets (0.25 mg total) by mouth at bedtime.   hydrochlorothiazide (HYDRODIURIL) 12.5 MG tablet Take 12.5 mg by mouth daily.   levETIRAcetam (KEPPRA) 500 MG tablet Take 1 tablet by mouth twice daily   losartan (COZAAR) 100 MG tablet Take 100 mg by mouth at bedtime.    pravastatin (PRAVACHOL) 40 MG tablet Take 40 mg by mouth at bedtime.    vitamin B-12 (CYANOCOBALAMIN) 1000 MCG tablet Take 1,000 mcg by mouth daily.   [DISCONTINUED] busPIRone  (BUSPAR) 5 MG tablet Take 1 tablet (5 mg total) by mouth daily. (Patient not taking: Reported on 07/09/2021)   No facility-administered encounter medications on file as of 07/09/2021.    Objective:   PHYSICAL EXAMINATION:    VITALS:   Vitals:   07/09/21 1255  BP: 134/76  Pulse: 77  SpO2: 94%  Weight: 142 lb (64.4 kg)  Height: '5\' 3"'$  (1.6 m)     GEN:  The patient appears stated age and is in NAD. HEENT:  Normocephalic, atraumatic.  The mucous membranes are moist. The superficial temporal arteries are without ropiness or tenderness. CV:  RRR Lungs:  CTAB Neck/HEME:  There are no carotid bruits bilaterally.  Neurological examination:  Orientation: The patient is alert and oriented to person and place.  Remembers this examiner Cranial nerves: There is good facial symmetry with mild facial hypomimia. The speech is fluent and clear. Soft palate rises symmetrically and there is no tongue deviation. Hearing is intact to conversational tone. Sensation: Sensation is intact to light touch throughout Motor: Strength is at least antigravity x4.  Movement examination: Tone: There is normal tone in the upper and lower extremities. Abnormal movements: There is left upper extremity rest tremor, mild Coordination:  There is minimal decremation with RAM's Gait and Station: The patient pushes off to arise.  She is slightly forward flexed.  Walks fairly well.  I have reviewed and interpreted the following labs independently    Chemistry      Component Value Date/Time   NA 142 10/04/2019 0855   K 4.5 10/04/2019 0855   CL 107 10/04/2019 0855   CO2 25 10/04/2019 0855   BUN 21 10/04/2019 0855   CREATININE 1.47 (H) 10/04/2019 0855      Component Value Date/Time   CALCIUM 9.5 10/04/2019 0855   ALKPHOS 58 09/13/2019 1822   AST 14 10/04/2019 0855   ALT 7 10/04/2019 0855   BILITOT 0.8 10/04/2019 0855       Lab Results  Component Value Date   WBC 5.4 10/04/2019   HGB 11.6 (L) 10/04/2019    HCT 35.2 10/04/2019   MCV 95.4 10/04/2019   PLT 199 10/04/2019    No results found for: TSH   Total time spent on today's visit was 30 minutes, including both face-to-face time and nonface-to-face time.  Time included that spent on review of records (prior notes available to me/labs/imaging if pertinent), discussing treatment and goals, answering patient's questions and coordinating care.  Cc:  Lowella Dandy, NP

## 2021-07-09 ENCOUNTER — Encounter: Payer: Self-pay | Admitting: Neurology

## 2021-07-09 ENCOUNTER — Ambulatory Visit: Payer: PPO | Admitting: Neurology

## 2021-07-09 ENCOUNTER — Other Ambulatory Visit: Payer: Self-pay

## 2021-07-09 VITALS — BP 134/76 | HR 77 | Ht 63.0 in | Wt 142.0 lb

## 2021-07-09 DIAGNOSIS — G20 Parkinson's disease: Secondary | ICD-10-CM

## 2021-07-09 MED ORDER — LEVETIRACETAM 250 MG PO TABS: 250.0000 mg | ORAL_TABLET | Freq: Two times a day (BID) | ORAL | 0 refills | Status: DC

## 2021-07-09 NOTE — Patient Instructions (Addendum)
Decrease keppra (levetiracetam) to 250 mg twice per day (you are currently on 500 mg twice per day) for ONE MONTH and then decrease keppra to 250 mg once per day for ONE MONTH and then STOP the medication  START melatonin, 3 mg at bedtime

## 2021-07-15 ENCOUNTER — Telehealth: Payer: Self-pay | Admitting: Neurology

## 2021-07-15 NOTE — Telephone Encounter (Signed)
Pt husband called, said Belinda Davis had a horrible nightmare last night. He wasn't sure if it was due to Tat changing her keppra. Would like a call to discuss things

## 2021-07-24 DIAGNOSIS — N959 Unspecified menopausal and perimenopausal disorder: Secondary | ICD-10-CM | POA: Diagnosis not present

## 2021-07-24 DIAGNOSIS — M85852 Other specified disorders of bone density and structure, left thigh: Secondary | ICD-10-CM | POA: Diagnosis not present

## 2021-07-24 DIAGNOSIS — Z1231 Encounter for screening mammogram for malignant neoplasm of breast: Secondary | ICD-10-CM | POA: Diagnosis not present

## 2021-07-25 ENCOUNTER — Other Ambulatory Visit: Payer: Self-pay | Admitting: Neurology

## 2021-07-25 DIAGNOSIS — G20 Parkinson's disease: Secondary | ICD-10-CM

## 2021-07-26 ENCOUNTER — Other Ambulatory Visit: Payer: Self-pay

## 2021-08-06 ENCOUNTER — Telehealth: Payer: Self-pay | Admitting: Neurology

## 2021-08-06 NOTE — Telephone Encounter (Signed)
Pt husband called, he said Belinda Davis's feet are giving her trouble. They are burning with pain. She wakes up about 3-4 in the am and they fell like they're on fire. She rubbed extra strength blue Emu on them but the husband thinks that made it worse

## 2021-08-06 NOTE — Telephone Encounter (Signed)
We can try just low dose gabapentin, 100 mg , 1 po q hs.  Call into pharmacy.  #90.  RF !

## 2021-08-07 ENCOUNTER — Other Ambulatory Visit: Payer: Self-pay

## 2021-08-07 DIAGNOSIS — G20 Parkinson's disease: Secondary | ICD-10-CM

## 2021-08-07 MED ORDER — GABAPENTIN 100 MG PO CAPS: 100.0000 mg | ORAL_CAPSULE | Freq: Every day | ORAL | 1 refills | Status: DC

## 2021-08-07 NOTE — Telephone Encounter (Signed)
Called in prescription and also called husband. He is very grateful and looking forward to trying something to help patient sleep. I told husband to let us know how she is reacting to the medication and if it is helping her in a week

## 2021-08-25 ENCOUNTER — Other Ambulatory Visit: Payer: Self-pay | Admitting: Neurology

## 2021-08-25 DIAGNOSIS — G20 Parkinson's disease: Secondary | ICD-10-CM

## 2021-08-26 ENCOUNTER — Other Ambulatory Visit: Payer: Self-pay

## 2021-08-28 ENCOUNTER — Telehealth: Payer: Self-pay | Admitting: Neurology

## 2021-08-28 NOTE — Telephone Encounter (Signed)
Patient currently taking 3 mg of melatonin . Patients husband is going to try the next dose up of 6 mg and see if that helps her at all. Patients husband said the 3mg  did not seem to do anything for her

## 2021-08-28 NOTE — Telephone Encounter (Signed)
Pt husband called to let Tat know Deaven is not sleeping at night. She will sleep a few hours then she'll be up the rest of the night. She takes 1/2 of a clonazepam.

## 2021-09-17 ENCOUNTER — Other Ambulatory Visit: Payer: Self-pay | Admitting: Neurology

## 2021-09-17 DIAGNOSIS — G20 Parkinson's disease: Secondary | ICD-10-CM

## 2021-09-24 IMAGING — MR MR HEAD W/O CM
9 of 10 series · 33 of 48 positions shown · non-contrast
Comparison: 09/14/2019 and earlier.

CLINICAL DATA: 78-year-old female code stroke presentation on
09/13/2019 with left side neglect. Status post IV tPA. Possible
right pontine lesion on follow-up head CT yesterday.

EXAM:
MRI HEAD WITHOUT CONTRAST
TECHNIQUE: Multiplanar, multiecho pulse sequences of the brain and surrounding
structures were obtained without intravenous contrast.

[Series 2: DWI · axial · 3.0mm · 0.94mm/px · z∈[-150,-6]mm · 8 of 100 slices shown (1 of 2)]
[im 1/100]
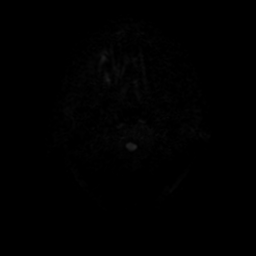
[im 12/100]
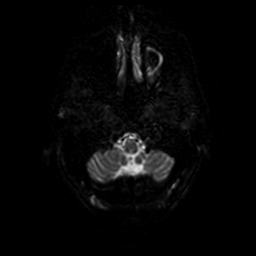
[im 34/100]
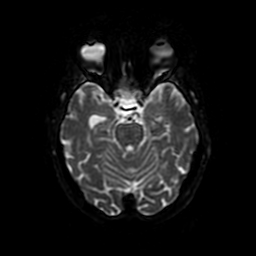
[im 45/100]
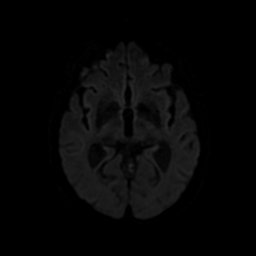
[im 56/100]
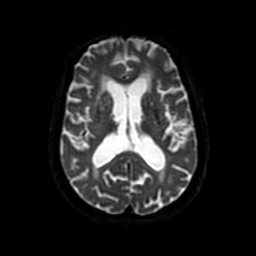
[im 67/100]
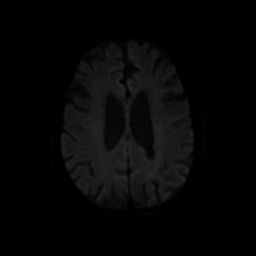
[im 89/100]
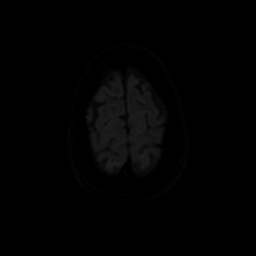
[im 100/100]
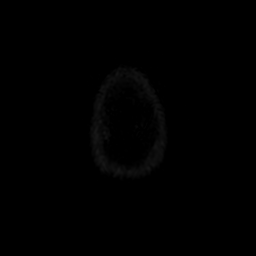

[Series 3: DWI · coronal · 4.0mm · 0.94mm/px · 6 of 72 slices shown (2 of 2)]
[im 1/72]
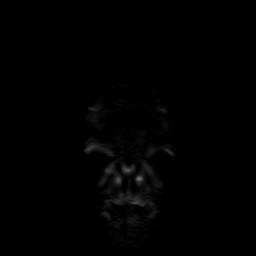
[im 15/72]
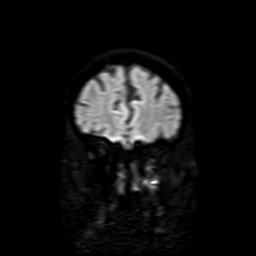
[im 29/72]
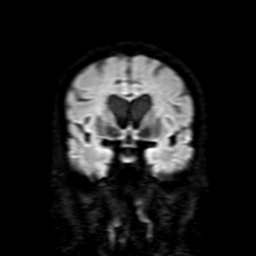
[im 43/72]
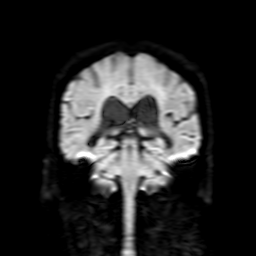
[im 57/72]
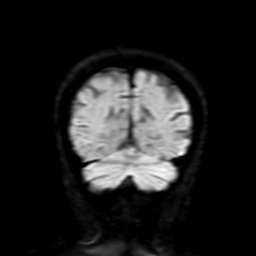
[im 72/72]
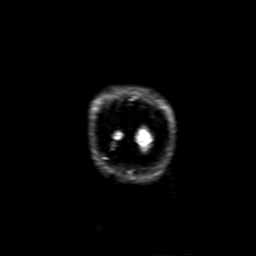

[Series 4: FLAIR · sagittal · 5.0mm · 0.47mm/px · 2 of 23 slices shown (1 of 2)]
[im 1/23]
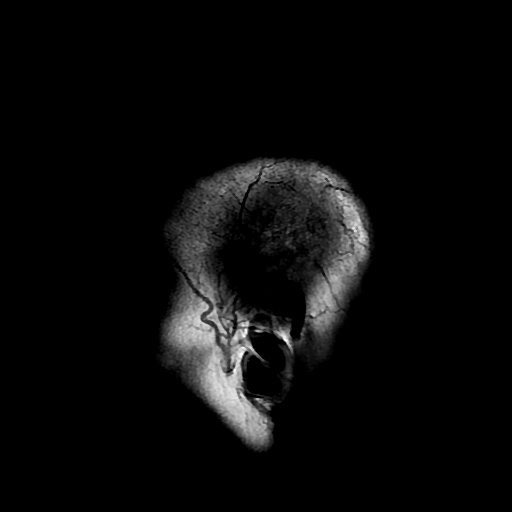
[im 23/23]
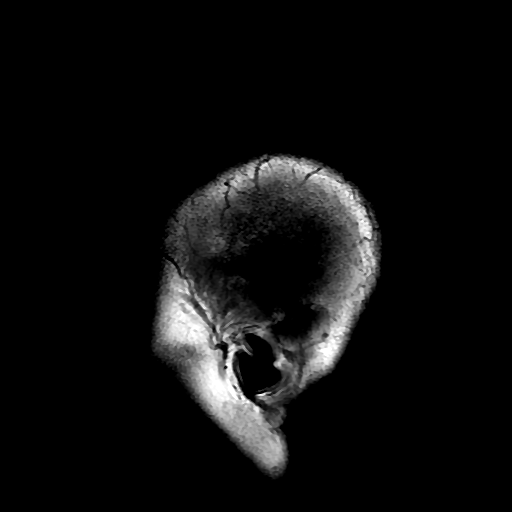

[Series 5: T2 · axial · 5.0mm · 0.47mm/px · z∈[-170,-6]mm · 3 of 29 slices shown (1 of 2)]
[im 1/29]
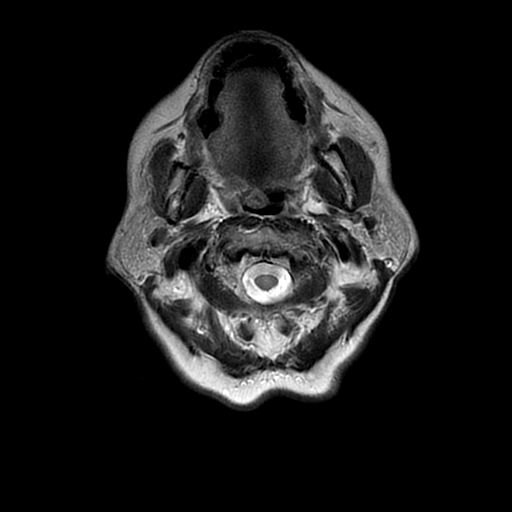
[im 15/29]
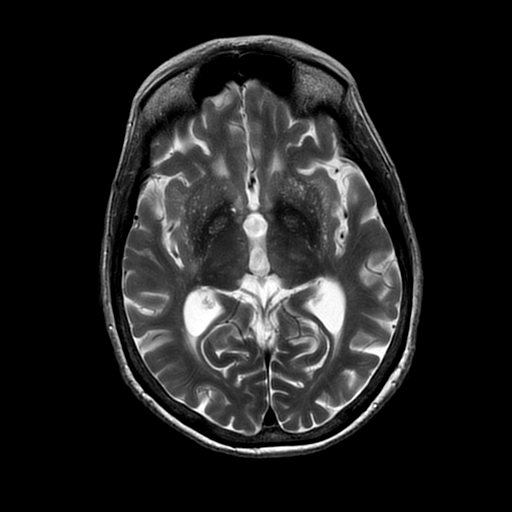
[im 29/29]
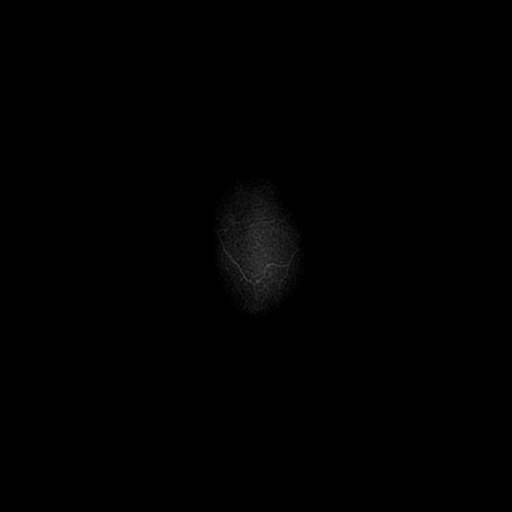

[Series 6: FLAIR · axial · 5.0mm · 0.47mm/px · z∈[-168,-5]mm · 3 of 29 slices shown (2 of 2)]
[im 1/29]
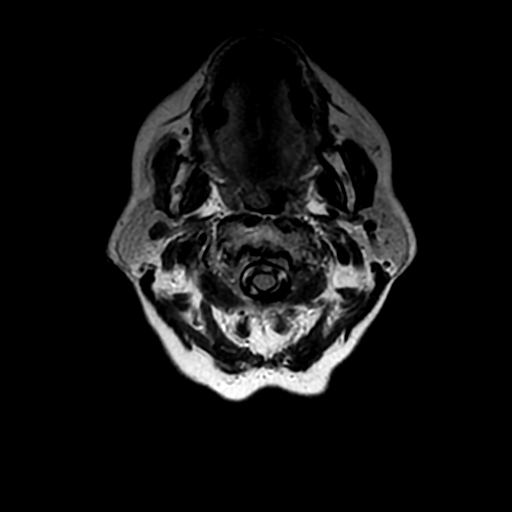
[im 15/29]
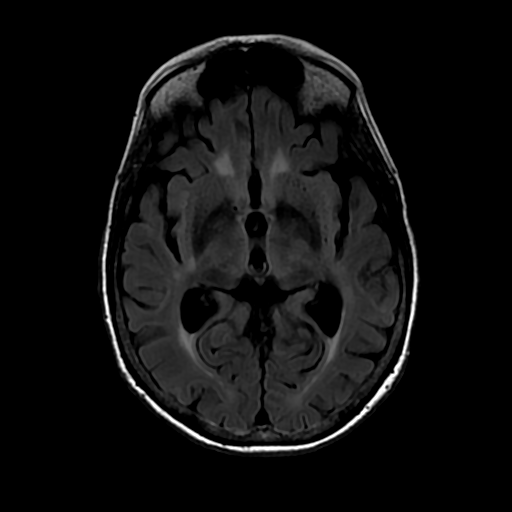
[im 29/29]
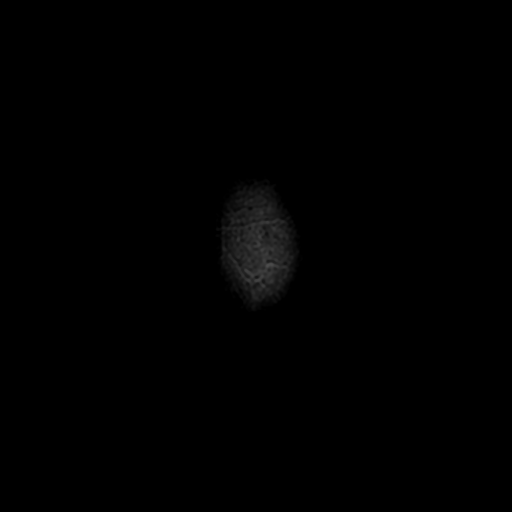

[Series 7: SWI · axial · 3.0mm · 0.47mm/px · z∈[-170,-152]mm · 2 of 116 slices shown]
[im 1/116]
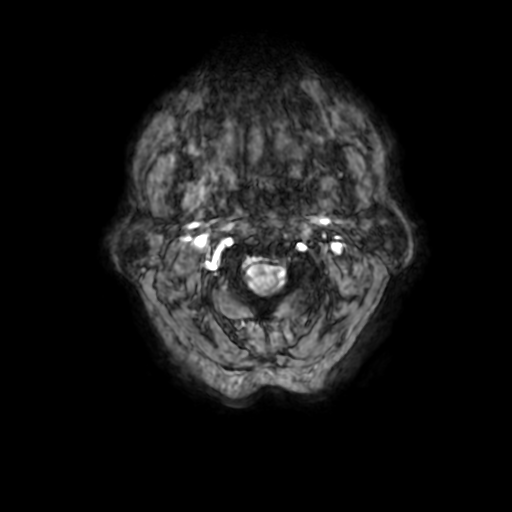
[im 13/116]
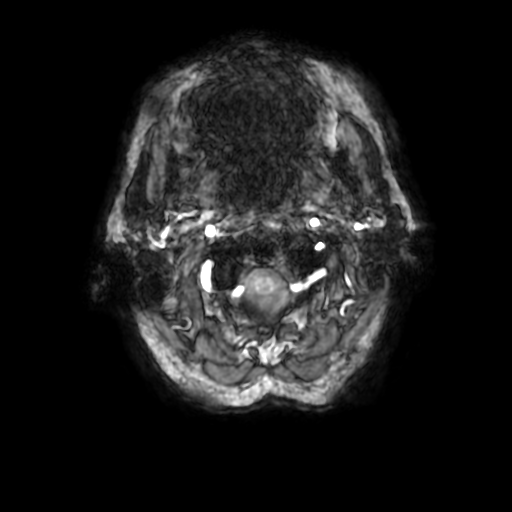

[Series 9: T2 · coronal · 5.0mm · 0.39mm/px · 2 of 27 slices shown (2 of 2)]
[im 1/27]
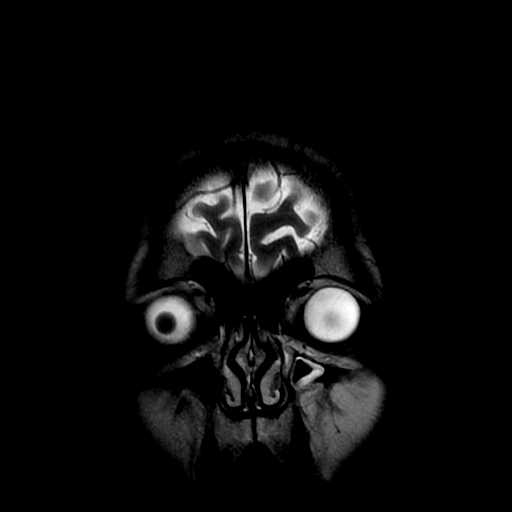
[im 27/27]
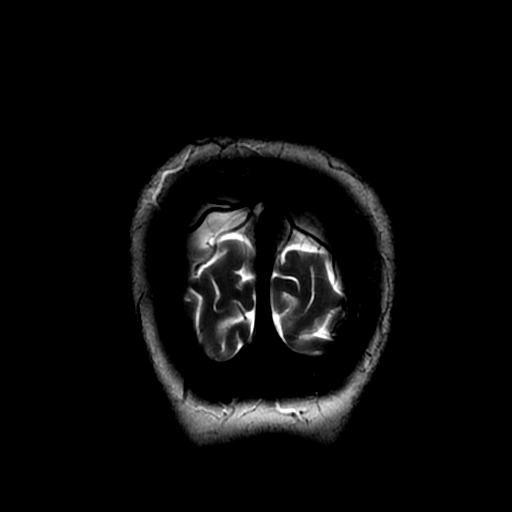

[Series 250: ADC · axial · 3.0mm · 0.94mm/px · z∈[-150,-6]mm · 4 of 50 slices shown (1 of 2)]
[im 1/50]
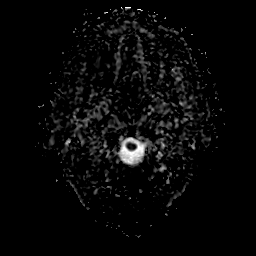
[im 17/50]
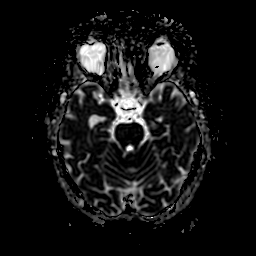
[im 33/50]
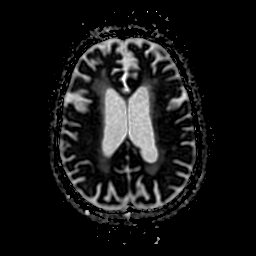
[im 50/50]
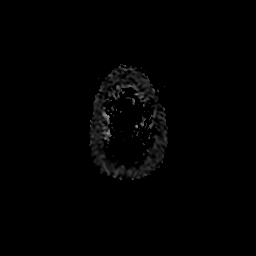

[Series 350: ADC · coronal · 4.0mm · 0.94mm/px · 3 of 36 slices shown (2 of 2)]
[im 1/36]
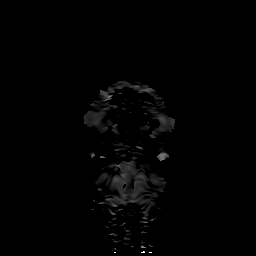
[im 18/36]
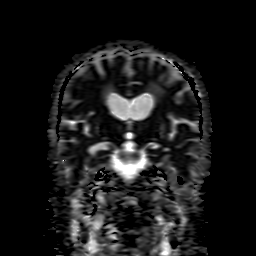
[im 36/36]
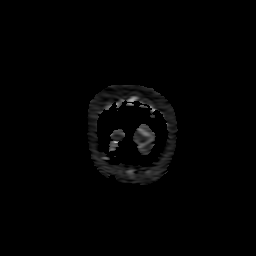

[33 of 48 positions shown; findings below may reference images not displayed]

FINDINGS: Brain: No restricted diffusion or evidence of acute infarction.

No acute intracranial hemorrhage or chronic blood products
identified.

No midline shift, mass effect, evidence of mass lesion,
ventriculomegaly, extra-axial collection or acute intracranial
hemorrhage. Cervicomedullary junction and pituitary are within
normal limits.

Patchy and confluent cerebral white matter T2 and FLAIR
hyperintensity in both hemispheres. Chronic appearing
encephalomalacia in the superior left sensory strip (series 5, image
24). No other cortical encephalomalacia identified. T2 heterogeneity
throughout the deep gray nuclei appear mostly due to perivascular
spaces, but some superimposed chronic small vessel ischemia is
suspected.

Up to moderate patchy T2 hyperintensity in the pons, but no direct
correlation to the possible right pontine CT lesion which I suspect
was artifact. Signal elsewhere in the brainstem and in the
cerebellum within normal limits.

Vascular: Major intracranial vascular flow voids are preserved.

Skull and upper cervical spine: Negative visible cervical spine.
Visualized bone marrow signal is within normal limits.

Sinuses/Orbits: Negative orbits. Left maxillary sinusitis with
inspissated material again noted. Trace or mild sinus mucosal
thickening elsewhere.

Other: Mastoids are clear. Visible internal auditory structures
appear normal. Scalp and face soft tissues appear negative.
IMPRESSION: 1. No acute intracranial abnormality; no acute or subacute infarct
identified and no acute or chronic hemorrhage.
2. Confluent cerebral white matter signal changes favored due to
chronic small vessel disease given underlying small chronic cortical
encephalomalacia in the left superior sensory strip.
Mild-to-moderate similar signal changes in the pons.
3. Chronic left maxillary sinusitis.

## 2021-09-26 DIAGNOSIS — R7303 Prediabetes: Secondary | ICD-10-CM | POA: Diagnosis not present

## 2021-09-26 DIAGNOSIS — D539 Nutritional anemia, unspecified: Secondary | ICD-10-CM | POA: Diagnosis not present

## 2021-09-26 DIAGNOSIS — R5383 Other fatigue: Secondary | ICD-10-CM | POA: Diagnosis not present

## 2021-09-26 DIAGNOSIS — N289 Disorder of kidney and ureter, unspecified: Secondary | ICD-10-CM | POA: Diagnosis not present

## 2021-09-26 DIAGNOSIS — E559 Vitamin D deficiency, unspecified: Secondary | ICD-10-CM | POA: Diagnosis not present

## 2021-09-26 DIAGNOSIS — R5381 Other malaise: Secondary | ICD-10-CM | POA: Diagnosis not present

## 2021-09-26 DIAGNOSIS — E785 Hyperlipidemia, unspecified: Secondary | ICD-10-CM | POA: Diagnosis not present

## 2021-09-26 DIAGNOSIS — I10 Essential (primary) hypertension: Secondary | ICD-10-CM | POA: Diagnosis not present

## 2021-09-26 DIAGNOSIS — Z6826 Body mass index (BMI) 26.0-26.9, adult: Secondary | ICD-10-CM | POA: Diagnosis not present

## 2021-09-26 DIAGNOSIS — R569 Unspecified convulsions: Secondary | ICD-10-CM | POA: Diagnosis not present

## 2021-09-26 DIAGNOSIS — R413 Other amnesia: Secondary | ICD-10-CM | POA: Diagnosis not present

## 2021-09-26 DIAGNOSIS — G20 Parkinson's disease: Secondary | ICD-10-CM | POA: Diagnosis not present

## 2021-10-07 ENCOUNTER — Other Ambulatory Visit: Payer: Self-pay | Admitting: Neurology

## 2021-10-07 DIAGNOSIS — G20 Parkinson's disease: Secondary | ICD-10-CM

## 2021-10-08 ENCOUNTER — Other Ambulatory Visit: Payer: Self-pay

## 2021-10-14 ENCOUNTER — Other Ambulatory Visit: Payer: Self-pay | Admitting: Neurology

## 2021-10-14 ENCOUNTER — Ambulatory Visit: Payer: PPO | Admitting: Neurology

## 2021-10-14 DIAGNOSIS — G20 Parkinson's disease: Secondary | ICD-10-CM

## 2021-10-22 NOTE — Progress Notes (Signed)
Assessment/Plan:   1.  Parkinsons Disease with PDD  -Continue carbidopa/levodopa 25/100 CR, 1 tablet at 8 AM/noon/4 PM/8 PM   2.  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  Several times she has tried to go up to 1 tablet at bedtime, but ultimately ended up coming back down to half a tablet because of hangover effect.  -pt on gabapentin, 100 mg but they are only taking prn.  3.  History of mental status change, possible seizure  -Presented to the hospital in October, 2020 with mental status change, receptive aphasia and hemianopsia.  Received TPA.  MRI was negative.  EEG with transient sharps in the left temporal region, felt to be a normal variant.  Patient was nonetheless started on Keppra, which had to be decreased because of side effects.             -Patient was supposed to stop the keppra last visit but must have been misunderstanding and she is still on it.  She will slowly decrease the medication and get off of it.   There were some possible mood concerns on it, but it was unclear at the time if it was due to Keppra (likely not)   4.  Insomnia  -told her to start the melatonin faithfully  -if melatonin taken faithfully doesn't help will have her start taking the low dose gabapentin daily at bed.   Subjective:   Belinda Davis was seen today in follow up for Parkinsons disease.  My previous records were reviewed prior to todays visit as well as outside records available to me.  Husband accompanies the patient and supplements the history.  Patient's Keppra was supposed to be discontinued since last visit but for some reason she returns still on the medication.  She is only on 1/2 tablet twice per day.  She has been seizure-free.  Husband did call me at the end of September with burning paresthesias in the feet and legs at night.  We gave her low-dose gabapentin, 100 mg at bedtime.   Husband states that she has only taken 2 pills since we RX it -  it helped when she took it.   He also called me in October stating patient was having trouble sleeping.  She is on half tablet of clonazepam at bedtime.  I told them to increase her melatonin to 6 mg and see how she did.  She admits she isn't taking that nightly.    Current prescribed movement disorder medications: Clonazepam 0.5 mg, half tablet at bedtime (has been on higher dosages with hangover effect) Carbidopa/levodopa 25/100 CR, 1 tablet at 8 AM/noon/4 PM/8 PM BuSpar, 5 mg daily.  Prior meds:  buspar, 5mg  (felt loopy but only took 1 or 2 tablets); Keppra  ALLERGIES:  No Known Allergies  CURRENT MEDICATIONS:  Outpatient Encounter Medications as of 10/25/2021  Medication Sig   aspirin EC 81 MG EC tablet Take 1 tablet (81 mg total) by mouth daily.   Carbidopa-Levodopa ER (SINEMET CR) 25-100 MG tablet controlled release TAKE 1 TABLET BY MOUTH 4 TIMES DAILY AT  8AM,  NOON,  4PM  AND  8PM   Cholecalciferol (VITAMIN D-3 PO) Take 1 capsule by mouth at bedtime.   clonazePAM (KLONOPIN) 0.5 MG tablet Take 0.5 tablets (0.25 mg total) by mouth at bedtime.   gabapentin (NEURONTIN) 100 MG capsule Take 1 capsule (100 mg total) by mouth at bedtime. (Patient taking differently: Take 100 mg by  mouth as needed.)   hydrochlorothiazide (HYDRODIURIL) 12.5 MG tablet Take 12.5 mg by mouth daily.   levETIRAcetam (KEPPRA) 250 MG tablet Take 1 tablet by mouth twice daily   losartan (COZAAR) 100 MG tablet Take 100 mg by mouth at bedtime.    pravastatin (PRAVACHOL) 40 MG tablet Take 40 mg by mouth at bedtime.    vitamin B-12 (CYANOCOBALAMIN) 1000 MCG tablet Take 1,000 mcg by mouth daily.   No facility-administered encounter medications on file as of 10/25/2021.    Objective:   PHYSICAL EXAMINATION:    VITALS:   Vitals:   10/25/21 0805  BP: 130/62  Pulse: 62  SpO2: 96%  Weight: 145 lb 9.6 oz (66 kg)  Height: 5\' 2"  (1.575 m)      GEN:  The patient appears stated age and is in NAD. HEENT:   Normocephalic, atraumatic.  The mucous membranes are moist. The superficial temporal arteries are without ropiness or tenderness. CV:  RRR Lungs:  CTAB Neck/HEME:  There are no carotid bruits bilaterally.  Neurological examination:  Orientation: The patient is alert and oriented to person and place.  Remembers this examiner Cranial nerves: There is good facial symmetry with mild facial hypomimia. The speech is fluent and clear. Soft palate rises symmetrically and there is no tongue deviation. Hearing is intact to conversational tone. Sensation: Sensation is intact to light touch throughout Motor: Strength is at least antigravity x4.  Movement examination: Tone: There is normal tone in the upper and lower extremities. Abnormal movements: There is no rest tremor today Coordination:  There is minimal decremation with RAM's Gait and Station: The patient pushes off to arise.  She is slightly forward flexed.  Walks fairly well.  I have reviewed and interpreted the following labs independently    Chemistry      Component Value Date/Time   NA 142 10/04/2019 0855   K 4.5 10/04/2019 0855   CL 107 10/04/2019 0855   CO2 25 10/04/2019 0855   BUN 21 10/04/2019 0855   CREATININE 1.47 (H) 10/04/2019 0855      Component Value Date/Time   CALCIUM 9.5 10/04/2019 0855   ALKPHOS 58 09/13/2019 1822   AST 14 10/04/2019 0855   ALT 7 10/04/2019 0855   BILITOT 0.8 10/04/2019 0855       Lab Results  Component Value Date   WBC 5.4 10/04/2019   HGB 11.6 (L) 10/04/2019   HCT 35.2 10/04/2019   MCV 95.4 10/04/2019   PLT 199 10/04/2019    No results found for: TSH  Time, face to face and non face to face:  32 min  Cc:  Moon, Amy A, NP

## 2021-10-25 ENCOUNTER — Ambulatory Visit: Payer: PPO | Admitting: Neurology

## 2021-10-25 ENCOUNTER — Other Ambulatory Visit: Payer: Self-pay

## 2021-10-25 ENCOUNTER — Encounter: Payer: Self-pay | Admitting: Neurology

## 2021-10-25 VITALS — BP 130/62 | HR 62 | Ht 62.0 in | Wt 145.6 lb

## 2021-10-25 DIAGNOSIS — G20 Parkinson's disease: Secondary | ICD-10-CM | POA: Diagnosis not present

## 2021-10-25 DIAGNOSIS — G4700 Insomnia, unspecified: Secondary | ICD-10-CM

## 2021-10-25 NOTE — Patient Instructions (Addendum)
      Decrease keppra (levetiracetam) to 250 mg once per day for two weeks   and then STOP the medication   Take melatonin every night, 5-6 mg at bed  You can continue your gabapentin as needed but if your sleep doesn't get better with nightly melatonin, we will start using this every night  The physicians and staff at [facility] are committed to providing excellent care. You may receive a survey requesting feedback about your experience at our office. We strive to receive "very good" responses to the survey questions. If you feel that your experience would prevent you from giving the office a "very good " response, please contact our office to try to remedy the situation. We may be reached at [phone]. Thank you for taking the time out of your busy day to complete the survey.

## 2021-11-01 ENCOUNTER — Encounter: Payer: Self-pay | Admitting: Neurology

## 2021-12-12 ENCOUNTER — Other Ambulatory Visit: Payer: Self-pay | Admitting: Neurology

## 2022-01-03 DIAGNOSIS — E538 Deficiency of other specified B group vitamins: Secondary | ICD-10-CM | POA: Diagnosis not present

## 2022-01-03 DIAGNOSIS — E559 Vitamin D deficiency, unspecified: Secondary | ICD-10-CM | POA: Diagnosis not present

## 2022-01-03 DIAGNOSIS — G20 Parkinson's disease: Secondary | ICD-10-CM | POA: Diagnosis not present

## 2022-01-03 DIAGNOSIS — R7303 Prediabetes: Secondary | ICD-10-CM | POA: Diagnosis not present

## 2022-01-03 DIAGNOSIS — D539 Nutritional anemia, unspecified: Secondary | ICD-10-CM | POA: Diagnosis not present

## 2022-01-03 DIAGNOSIS — F419 Anxiety disorder, unspecified: Secondary | ICD-10-CM | POA: Diagnosis not present

## 2022-01-03 DIAGNOSIS — R569 Unspecified convulsions: Secondary | ICD-10-CM | POA: Diagnosis not present

## 2022-01-03 DIAGNOSIS — N1831 Chronic kidney disease, stage 3a: Secondary | ICD-10-CM | POA: Diagnosis not present

## 2022-01-03 DIAGNOSIS — E785 Hyperlipidemia, unspecified: Secondary | ICD-10-CM | POA: Diagnosis not present

## 2022-01-03 DIAGNOSIS — I10 Essential (primary) hypertension: Secondary | ICD-10-CM | POA: Diagnosis not present

## 2022-01-03 DIAGNOSIS — Z6826 Body mass index (BMI) 26.0-26.9, adult: Secondary | ICD-10-CM | POA: Diagnosis not present

## 2022-01-03 DIAGNOSIS — R413 Other amnesia: Secondary | ICD-10-CM | POA: Diagnosis not present

## 2022-01-13 ENCOUNTER — Other Ambulatory Visit: Payer: Self-pay | Admitting: Neurology

## 2022-01-13 DIAGNOSIS — G20 Parkinson's disease: Secondary | ICD-10-CM

## 2022-04-25 ENCOUNTER — Other Ambulatory Visit: Payer: Self-pay | Admitting: Neurology

## 2022-04-25 DIAGNOSIS — G20 Parkinson's disease: Secondary | ICD-10-CM

## 2022-04-28 NOTE — Progress Notes (Signed)
Assessment/Plan:   1.  Parkinsons Disease with PDD  -Continue carbidopa/levodopa 25/100 CR, 1 tablet at 8 AM/noon/4 PM/8 PM.  This version of levodopa has become expensive.  Discussed [employer] and they are going to be looking into that.  -Discussed importance of increasing exercise.   2.  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  Several times she has tried to go up to 1 tablet at bedtime, but ultimately ended up coming back down to half a tablet because of hangover effect.  -pt on gabapentin, 100 mg but they are only taking prn.  3.  History of mental status change, possible seizure  -Presented to the hospital in October, 2020 with mental status change, receptive aphasia and hemianopsia.  Received TPA.  MRI was negative.  EEG with transient sharps in the left temporal region, felt to be a normal variant.  Patient was nonetheless started on Keppra, which had to be decreased because of side effects.  Patient is now off of the [place] and doing well.     Subjective:   Belinda Davis was seen today in follow up for Parkinsons disease.  My previous records were reviewed prior to todays visit as well as outside records available to me.  Husband accompanies the patient and supplements the history.  Patient's Keppra has been weaned off and she is doing well without that.  No falls.  She is walking bit more for exercise.  Both patient and husband agree she is doing very well.  Levodopa has become a bit expensive.  Current prescribed movement disorder medications: Clonazepam 0.5 mg, half tablet at bedtime (has been on higher dosages with hangover effect) Gabapentin, 100 mg at bedtime as needed Carbidopa/levodopa 25/100 CR, 1 tablet at 8 AM/noon/4 PM/8 PM BuSpar, 5 mg daily.  Prior meds:  buspar, '5mg'$  (felt loopy but only took 1 or 2 tablets); Keppra  ALLERGIES:  No Known Allergies  CURRENT MEDICATIONS:  Outpatient Encounter Medications as of  04/29/2022  Medication Sig   aspirin EC 81 MG EC tablet Take 1 tablet (81 mg total) by mouth daily.   Carbidopa-Levodopa ER (SINEMET CR) 25-100 MG tablet controlled release TAKE 1 TABLET BY MOUTH 4 TIMES DAILY AT  8  AM,  NOON,  4  PM  AND  8  PM   Cholecalciferol (VITAMIN D-3 PO) Take 1 capsule by mouth at bedtime.   clonazePAM (KLONOPIN) 0.5 MG tablet TAKE 1/2 (ONE-HALF) TABLET BY MOUTH AT BEDTIME   hydrochlorothiazide (HYDRODIURIL) 12.5 MG tablet Take 12.5 mg by mouth daily.   losartan (COZAAR) 100 MG tablet Take 100 mg by mouth at bedtime.    pravastatin (PRAVACHOL) 40 MG tablet Take 40 mg by mouth at bedtime.    vitamin B-12 (CYANOCOBALAMIN) 1000 MCG tablet Take 1,000 mcg by mouth daily.   [DISCONTINUED] gabapentin (NEURONTIN) 100 MG capsule Take 1 capsule (100 mg total) by mouth at bedtime. (Patient taking differently: Take 100 mg by mouth as needed.)   No facility-administered encounter medications on file as of 04/29/2022.    Objective:   PHYSICAL EXAMINATION:    VITALS:   Vitals:   04/29/22 1103  BP: (!) 110/56  Pulse: (!) 58  SpO2: 95%  Weight: 143 lb 12.8 oz (65.2 kg)  Height: '5\' 2"'$  (1.575 m)       GEN:  The patient appears stated age and is in NAD. HEENT:  Normocephalic, atraumatic.  The mucous membranes are moist. The  superficial temporal arteries are without ropiness or tenderness. CV:  RRR Lungs:  CTAB Neck/HEME:  There are no carotid bruits bilaterally.  Neurological examination:  Orientation: The patient is alert and oriented to person and place.  Remembers this examiner Cranial nerves: There is good facial symmetry with mild facial hypomimia. The speech is fluent and clear. Soft palate rises symmetrically and there is no tongue deviation. Hearing is intact to conversational tone. Sensation: Sensation is intact to light touch throughout Motor: Strength is at least antigravity x4.  Movement examination: Tone: There is normal tone in the upper and lower  extremities. Abnormal movements: There is no rest tremor today Coordination:  There is minimal decremation with RAM's Gait and Station: The patient pushes off to arise.  She is slightly forward flexed.  Walks fairly well.  This is really stable from prior examination.  I have reviewed and interpreted the following labs independently    Chemistry      Component Value Date/Time   NA 142 10/04/2019 0855   K 4.5 10/04/2019 0855   CL 107 10/04/2019 0855   CO2 25 10/04/2019 0855   BUN 21 10/04/2019 0855   CREATININE 1.47 (H) 10/04/2019 0855      Component Value Date/Time   CALCIUM 9.5 10/04/2019 0855   ALKPHOS 58 09/13/2019 1822   AST 14 10/04/2019 0855   ALT 7 10/04/2019 0855   BILITOT 0.8 10/04/2019 0855       Lab Results  Component Value Date   WBC 5.4 10/04/2019   HGB 11.6 (L) 10/04/2019   HCT 35.2 10/04/2019   MCV 95.4 10/04/2019   PLT 199 10/04/2019    No results found for: "TSH"  Total time spent on today's visit was 21 minutes, including both face-to-face time and nonface-to-face time.  Time included that spent on review of records (prior notes available to me/labs/imaging if pertinent), discussing treatment and goals, answering patient's questions and coordinating care.   Cc:  Lowella Dandy, NP

## 2022-04-29 ENCOUNTER — Ambulatory Visit: Payer: PPO | Admitting: Neurology

## 2022-04-29 ENCOUNTER — Encounter: Payer: Self-pay | Admitting: Neurology

## 2022-04-29 ENCOUNTER — Telehealth: Payer: Self-pay | Admitting: Neurology

## 2022-04-29 DIAGNOSIS — G20 Parkinson's disease: Secondary | ICD-10-CM | POA: Diagnosis not present

## 2022-04-29 MED ORDER — CARBIDOPA-LEVODOPA ER 25-100 MG PO TBCR: EXTENDED_RELEASE_TABLET | ORAL | 2 refills | Status: DC

## 2022-05-05 DIAGNOSIS — N1831 Chronic kidney disease, stage 3a: Secondary | ICD-10-CM | POA: Diagnosis not present

## 2022-05-05 DIAGNOSIS — R413 Other amnesia: Secondary | ICD-10-CM | POA: Diagnosis not present

## 2022-05-05 DIAGNOSIS — E785 Hyperlipidemia, unspecified: Secondary | ICD-10-CM | POA: Diagnosis not present

## 2022-05-05 DIAGNOSIS — E559 Vitamin D deficiency, unspecified: Secondary | ICD-10-CM | POA: Diagnosis not present

## 2022-05-05 DIAGNOSIS — G40909 Epilepsy, unspecified, not intractable, without status epilepticus: Secondary | ICD-10-CM | POA: Diagnosis not present

## 2022-05-05 DIAGNOSIS — D539 Nutritional anemia, unspecified: Secondary | ICD-10-CM | POA: Diagnosis not present

## 2022-05-05 DIAGNOSIS — I10 Essential (primary) hypertension: Secondary | ICD-10-CM | POA: Diagnosis not present

## 2022-05-05 DIAGNOSIS — M810 Age-related osteoporosis without current pathological fracture: Secondary | ICD-10-CM | POA: Diagnosis not present

## 2022-05-05 DIAGNOSIS — F419 Anxiety disorder, unspecified: Secondary | ICD-10-CM | POA: Diagnosis not present

## 2022-05-05 DIAGNOSIS — G20 Parkinson's disease: Secondary | ICD-10-CM | POA: Diagnosis not present

## 2022-05-05 DIAGNOSIS — R7303 Prediabetes: Secondary | ICD-10-CM | POA: Diagnosis not present

## 2022-05-05 DIAGNOSIS — E538 Deficiency of other specified B group vitamins: Secondary | ICD-10-CM | POA: Diagnosis not present

## 2022-06-04 ENCOUNTER — Other Ambulatory Visit: Payer: Self-pay | Admitting: Neurology

## 2022-06-05 DIAGNOSIS — R519 Headache, unspecified: Secondary | ICD-10-CM | POA: Diagnosis not present

## 2022-06-05 DIAGNOSIS — I10 Essential (primary) hypertension: Secondary | ICD-10-CM | POA: Diagnosis not present

## 2022-06-06 ENCOUNTER — Telehealth: Payer: Self-pay | Admitting: Neurology

## 2022-06-06 DIAGNOSIS — R519 Headache, unspecified: Secondary | ICD-10-CM | POA: Diagnosis not present

## 2022-06-06 NOTE — Telephone Encounter (Signed)
Pt's husband called in and left an access nurse on 06/05/22 at 9:17 PM. He states his wife is having a problem with her blood pressure. Last reading was 182/71 and steady rising. She has a headache and can't eat. I have placed the full access nurse report in Dr. Doristine Devoid box.

## 2022-06-06 NOTE — Telephone Encounter (Signed)
Called patients husband and he states that patient did not sleep all last night due to her headache. Patient has taken Motrin and Tylenol which have not helped much.   Yesterday at noon patient began complaining of a headache, so husband states he took her pressure. Blood pressure readings: 12:00 pm- 143/61, 1:45 pm- 168/65, 2:45 pm- 164/71.  Patients husband states he called Dr. Manson Allan and an appt was scheduled at 4:30 pm. At her appt with Dr. Manson Allan patients bp was 153/66. Dr. Manson Allan stated her pressure seems to be fluctuating. Dr. Manson Allan prescribed her amlodipine.   8:50 pm last night patient began complaining about her headache again and her pressure read-182/71. Patient called and spoke to the access nurse and was informed to call 911. EMS was called and when they came out to check on patient they informed them that there wasn't really any reason for them to take her in but if he wanted them to take her they would. Patients husband stated that the paramedic told them that patient needs to eat bananas. So at [place] last night patients husband ran to the store to purchase bananas for her.     Patients had not eaten anything since yesterday. But at 4 am this morning husband made her some instant grits and smokey sausages which she ate. Patient then sat on the recliner and went to sleep at 6 am. Then at 7 am patient complained again about her headache and now stomach hurting because she felt constipated.   Per patients husband patient is not disoriented, but she kept asking husband when are they going to pick up her medicine? Husband said "Hunny we have already picked up your medicine" and patients husband stated that she asked 4 more times.   Patients husband aware I will send this message to Dr. Carles Collet and give him a call back.

## 2022-06-06 NOTE — Telephone Encounter (Signed)
Called patients husband and informed him of Dr. Doristine Devoid recommendations of UC or ER and to call PCP about blood pressure not being stable. Patients husband stated that the headache seems to be wearing off and if it does comes back he will definitely do as Dr. Carles Collet recommends.

## 2022-06-09 DIAGNOSIS — I10 Essential (primary) hypertension: Secondary | ICD-10-CM | POA: Diagnosis not present

## 2022-06-09 DIAGNOSIS — R399 Unspecified symptoms and signs involving the genitourinary system: Secondary | ICD-10-CM | POA: Diagnosis not present

## 2022-06-09 DIAGNOSIS — R413 Other amnesia: Secondary | ICD-10-CM | POA: Diagnosis not present

## 2022-06-09 DIAGNOSIS — Z1331 Encounter for screening for depression: Secondary | ICD-10-CM | POA: Diagnosis not present

## 2022-06-09 DIAGNOSIS — R519 Headache, unspecified: Secondary | ICD-10-CM | POA: Diagnosis not present

## 2022-06-10 ENCOUNTER — Telehealth: Payer: Self-pay | Admitting: Neurology

## 2022-06-10 DIAGNOSIS — I10 Essential (primary) hypertension: Secondary | ICD-10-CM | POA: Diagnosis not present

## 2022-06-10 DIAGNOSIS — R531 Weakness: Secondary | ICD-10-CM | POA: Diagnosis not present

## 2022-06-10 DIAGNOSIS — E039 Hypothyroidism, unspecified: Secondary | ICD-10-CM | POA: Diagnosis not present

## 2022-06-10 DIAGNOSIS — R519 Headache, unspecified: Secondary | ICD-10-CM | POA: Diagnosis not present

## 2022-06-10 DIAGNOSIS — J9811 Atelectasis: Secondary | ICD-10-CM | POA: Diagnosis not present

## 2022-06-10 NOTE — Telephone Encounter (Signed)
Called husband and they are going to hospital ASAP

## 2022-06-10 NOTE — Telephone Encounter (Signed)
Patients husband said he is able to communicate, no drooping of the face but the sudden weakness from yesterday has him very very concerned

## 2022-06-10 NOTE — Telephone Encounter (Signed)
Patient's spouse Belinda Davis called to report the patient cannot walk as of this morning without help.  She is very weak and he is concerned about these symptoms coming on so quickly since yesterday.

## 2022-06-11 ENCOUNTER — Telehealth: Payer: Self-pay

## 2022-06-11 ENCOUNTER — Telehealth: Payer: Self-pay | Admitting: Neurology

## 2022-06-11 NOTE — Telephone Encounter (Signed)
Patient's husband said his niece will help get the medical records faxed as soon as possible, she works at that hospital.  He'll call in the morning to be sure we received them.

## 2022-06-11 NOTE — Telephone Encounter (Signed)
Called Pt and talked to husband and he is coming up here tomorrow and sign a release for [place] to get ED records from Hospital. Also told him the perscription is at Pharmacy.

## 2022-06-11 NOTE — Telephone Encounter (Signed)
Patients husband called and stated Belinda Davis needs a refill on clonazepam, they use [facility] in [place].  He also stated she had went to the ED yesterday.  She had a headache for a week, and she couldn't walk or use her arms.  He says she has no memory.  The hospital and PCP dont know what going on.  He is worried and does not know what to do from here.

## 2022-06-12 NOTE — Telephone Encounter (Signed)
[facility] records reviewed and under media now.  Exam unremarkable there.  Her Creatinine was up to 1.7, which may indicate she was dehydrated.  She was to f/u with pcp in a week.  She needs to do that first and see how she responds.  CT brain neg

## 2022-06-12 NOTE — Telephone Encounter (Signed)
We received the records from the ED. They have been scanned in.

## 2022-06-12 NOTE — Telephone Encounter (Signed)
Called and talked to pt have not received fax at this time . He is coming up to [place] papers and sign and release form.

## 2022-06-13 NOTE — Telephone Encounter (Signed)
Explained results and that they need to follow up with the PCP within a week. Patient is doing better and they are giving her plenty of water and electrolytes

## 2022-06-19 DIAGNOSIS — R519 Headache, unspecified: Secondary | ICD-10-CM | POA: Diagnosis not present

## 2022-06-19 DIAGNOSIS — R413 Other amnesia: Secondary | ICD-10-CM | POA: Diagnosis not present

## 2022-06-19 DIAGNOSIS — N289 Disorder of kidney and ureter, unspecified: Secondary | ICD-10-CM | POA: Diagnosis not present

## 2022-06-19 DIAGNOSIS — Z139 Encounter for screening, unspecified: Secondary | ICD-10-CM | POA: Diagnosis not present

## 2022-06-19 DIAGNOSIS — I10 Essential (primary) hypertension: Secondary | ICD-10-CM | POA: Diagnosis not present

## 2022-07-14 DIAGNOSIS — N1831 Chronic kidney disease, stage 3a: Secondary | ICD-10-CM | POA: Diagnosis not present

## 2022-07-14 DIAGNOSIS — G20 Parkinson's disease: Secondary | ICD-10-CM | POA: Diagnosis not present

## 2022-07-14 DIAGNOSIS — R413 Other amnesia: Secondary | ICD-10-CM | POA: Diagnosis not present

## 2022-07-14 DIAGNOSIS — I10 Essential (primary) hypertension: Secondary | ICD-10-CM | POA: Diagnosis not present

## 2022-07-14 DIAGNOSIS — R700 Elevated erythrocyte sedimentation rate: Secondary | ICD-10-CM | POA: Diagnosis not present

## 2022-07-28 DIAGNOSIS — E785 Hyperlipidemia, unspecified: Secondary | ICD-10-CM | POA: Diagnosis not present

## 2022-07-28 DIAGNOSIS — Z1331 Encounter for screening for depression: Secondary | ICD-10-CM | POA: Diagnosis not present

## 2022-07-28 DIAGNOSIS — Z0000 Encounter for general adult medical examination without abnormal findings: Secondary | ICD-10-CM | POA: Diagnosis not present

## 2022-07-28 DIAGNOSIS — Z9181 History of falling: Secondary | ICD-10-CM | POA: Diagnosis not present

## 2022-09-15 DIAGNOSIS — L299 Pruritus, unspecified: Secondary | ICD-10-CM | POA: Diagnosis not present

## 2022-09-15 DIAGNOSIS — I10 Essential (primary) hypertension: Secondary | ICD-10-CM | POA: Diagnosis not present

## 2022-09-15 DIAGNOSIS — E785 Hyperlipidemia, unspecified: Secondary | ICD-10-CM | POA: Diagnosis not present

## 2022-09-15 DIAGNOSIS — R7303 Prediabetes: Secondary | ICD-10-CM | POA: Diagnosis not present

## 2022-09-15 DIAGNOSIS — N1831 Chronic kidney disease, stage 3a: Secondary | ICD-10-CM | POA: Diagnosis not present

## 2022-09-15 DIAGNOSIS — D539 Nutritional anemia, unspecified: Secondary | ICD-10-CM | POA: Diagnosis not present

## 2022-09-15 DIAGNOSIS — M810 Age-related osteoporosis without current pathological fracture: Secondary | ICD-10-CM | POA: Diagnosis not present

## 2022-09-15 DIAGNOSIS — R413 Other amnesia: Secondary | ICD-10-CM | POA: Diagnosis not present

## 2022-11-03 NOTE — Progress Notes (Signed)
Assessment/Plan:   1.  Parkinsons Disease with PDD  -Continue carbidopa/levodopa 25/100 CR, 1 tablet at 8 AM/noon/4 PM/8 PM.  This version of levodopa has become expensive.  Discussed [employer] and they are going to be looking into that.  -Discussed importance of increasing exercise.   2.  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  Several times she has tried to go up to 1 tablet at bedtime, but ultimately ended up coming back down to half a tablet because of hangover effect.  PDMP is reviewed.  Last refill was October 21  -pt on gabapentin, 100 mg but they are only taking prn.  3.  History of mental status change, possible seizure  -Presented to the hospital in October, 2020 with mental status change, receptive aphasia and hemianopsia.  Received TPA.  MRI was negative.  EEG with transient sharps in the left temporal region, felt to be a normal variant.  Patient was nonetheless started on Keppra, which had to be decreased because of side effects.  Patient is now off of the [place] and doing well.   Subjective:   Belinda Davis was seen today in follow up for Parkinsons disease.  My previous records were reviewed prior to todays visit as well as outside records available to me.  Husband accompanies the patient and supplements the history.  Patient went to the emergency room with diffuse weakness July 25.  She went to [facility].  She was evaluated only in the emergency room.  Her creatinine was up to 1.7.  CT brain was negative.  Patients husband states that she did better after she was hydrated, but the symptoms did last about a week.  Current prescribed movement disorder medications: Clonazepam 0.5 mg, half tablet at bedtime (has been on higher dosages with hangover effect) Gabapentin, 100 mg at bedtime as needed Carbidopa/levodopa 25/100 CR, 1 tablet at 8 AM/noon/4 PM/8 PM BuSpar, 5 mg daily.  Prior meds:  buspar, '5mg'$  (felt loopy but  only took 1 or 2 tablets); Keppra (tolerated well at low dose)  ALLERGIES:  No Known Allergies  CURRENT MEDICATIONS:  Outpatient Encounter Medications as of 11/04/2022  Medication Sig   aspirin EC 81 MG EC tablet Take 1 tablet (81 mg total) by mouth daily.   Carbidopa-Levodopa ER (SINEMET CR) 25-100 MG tablet controlled release TAKE 1 TABLET BY MOUTH 4 TIMES DAILY AT  8AM,  NOON,  4PM  AND  8PM   Cholecalciferol (VITAMIN D-3 PO) Take 1 capsule by mouth at bedtime.   clonazePAM (KLONOPIN) 0.5 MG tablet TAKE 1/2 (ONE-HALF) TABLET BY MOUTH AT BEDTIME   hydrochlorothiazide (HYDRODIURIL) 12.5 MG tablet Take 12.5 mg by mouth daily.   losartan (COZAAR) 100 MG tablet Take 100 mg by mouth at bedtime.    pravastatin (PRAVACHOL) 40 MG tablet Take 40 mg by mouth at bedtime.    vitamin B-12 (CYANOCOBALAMIN) 1000 MCG tablet Take 1,000 mcg by mouth daily.   No facility-administered encounter medications on file as of 11/04/2022.    Objective:   PHYSICAL EXAMINATION:    VITALS:   There were no vitals filed for this visit.      GEN:  The patient appears stated age and is in NAD. HEENT:  Normocephalic, atraumatic.  The mucous membranes are moist. The superficial temporal arteries are without ropiness or tenderness. CV:  RRR Lungs:  CTAB Neck/HEME:  There are no carotid bruits bilaterally.  Neurological examination:  Orientation: The  patient is alert and oriented to person and place.  Remembers this examiner Cranial nerves: There is good facial symmetry with mild facial hypomimia. The speech is fluent and clear. Soft palate rises symmetrically and there is no tongue deviation. Hearing is intact to conversational tone. Sensation: Sensation is intact to light touch throughout Motor: Strength is at least antigravity x4.  Movement examination: Tone: There is normal tone in the upper and lower extremities. Abnormal movements: There is no rest tremor today Coordination:  There is minimal  decremation with RAM's Gait and Station: The patient pushes off to arise.  She is slightly forward flexed.  Walks fairly well.  This is really stable from prior examination.  I have reviewed and interpreted the following labs independently    Chemistry      Component Value Date/Time   NA 142 10/04/2019 0855   K 4.5 10/04/2019 0855   CL 107 10/04/2019 0855   CO2 25 10/04/2019 0855   BUN 21 10/04/2019 0855   CREATININE 1.47 (H) 10/04/2019 0855      Component Value Date/Time   CALCIUM 9.5 10/04/2019 0855   ALKPHOS 58 09/13/2019 1822   AST 14 10/04/2019 0855   ALT 7 10/04/2019 0855   BILITOT 0.8 10/04/2019 0855       Lab Results  Component Value Date   WBC 5.4 10/04/2019   HGB 11.6 (L) 10/04/2019   HCT 35.2 10/04/2019   MCV 95.4 10/04/2019   PLT 199 10/04/2019    No results found for: "TSH"  Total time spent on today's visit was *** minutes, including both face-to-face time and nonface-to-face time.  Time included that spent on review of records (prior notes available to me/labs/imaging if pertinent), discussing treatment and goals, answering patient's questions and coordinating care.   Cc:  Lowella Dandy, NP

## 2022-11-04 ENCOUNTER — Ambulatory Visit: Payer: PPO | Admitting: Neurology

## 2022-11-04 ENCOUNTER — Encounter: Payer: Self-pay | Admitting: Neurology

## 2022-11-04 VITALS — BP 118/78 | HR 73 | Ht 62.0 in | Wt 146.0 lb

## 2022-11-04 DIAGNOSIS — F02818 Dementia in other diseases classified elsewhere, unspecified severity, with other behavioral disturbance: Secondary | ICD-10-CM

## 2022-11-04 DIAGNOSIS — G20A1 Parkinson's disease without dyskinesia, without mention of fluctuations: Secondary | ICD-10-CM

## 2022-11-04 NOTE — Patient Instructions (Signed)
Local and Online Resources for Power over Parkinson's Group  December 2023    LOCAL [employer] PARKINSON'S GROUPS   Power over Parkinson's Group:    Power Over Parkinson's Patient Education Group will be Wednesday, December 13th-*Hybrid meting*- in person at [place] [facility] location and via WEBEX, 2:00-3:00 pm.   Starting in November, Power over Parkinson's and Care Partner Groups will meet together, with plans for separate break out session for caregivers (*this will be evolving over the next few months) Upcoming Power over Parkinson's Meetings/Care Partner Support:  2nd Wednesdays of the month at 2 pm:   December 13th, January 10th  Contact Amy Marriott at [email or web address] if interested in participating in this group    LOCAL EVENTS AND NEW OFFERINGS  Parkinson's Holiday Party!  Wednesday, December 6th, 4:00-5:00 pm.  [employer].  RSVP to Karen Simmers at [phone] or [email or web address] New PWR! Moves Community Fitness Instructor-Led Classes offering at [employer]!  TUESDAYS and Wednesdays 1-2 pm.   Contact Christy Weaver at  [email or web address]  or [phone] (Tuesday classes are modified for chair and standing only) Dance for Parkinson 's classes will be on Tuesdays 9:30am-10:30am starting October 3-December 12 with a break the week of November 21st. Located in the [place] [facility], in the first floor of the [facility] ([address] To register:  [email or web address] or [phone]  Drumming for Parkinson's will be held on 2nd and 4th Mondays at 11:00 am.   Located at the [employer] ([address] [place]  Contact Jane Maydian at [email or web address] or [phone]  Through support from the Parkinson's Foundation, the Dance and Drumming for Parkinson's classes are free for both patients and caregivers.    [employer] Parkinson's Tai Chi Class, Mondays at  11 am.  Call [phone] for details   ONLINE EDUCATION AND SUPPORT  Parkinson Foundation:  www.parkinson.org  PD Health at Home continues:  Mindfulness Mondays, Wellness Wednesdays, Fitness Fridays   Upcoming Education:    Eating and Feeling Well through the Holidays. Wednesday, Dec. 6th,  1-2 pm  Hospital Safety.  Wednesday, Dec. 13th, 1-2 pm Register for expert briefings (webinars) at [email or web address]://[email or web address].org/resources-support/online-education/expert-[email or web address]-webinars  Please check out their website to sign up for emails and see their full online offerings      Michael [employer] Fox Foundation:  www.michaeljfox.org   Third Thursday Webinars:  On the third Thursday of every month at 12 p.m. ET, join our free live webinars to learn about various aspects of living with Parkinson's disease and our work to speed medical breakthroughs.  Upcoming Webinar:  Tools for Diagnosing and Visualizing Parkinson's Disease.  Thursday, December 21st at 12 noon. Check out additional information on their website to see their full online offerings    Davis Phinney Foundation:  www.davisphinneyfoundation.org  Upcoming Webinar:   Stay tuned  Webinar Series:  Living with Parkinson's Meetup.   Third Thursdays each month, 3 pm  Care Partner Monthly Meetup.  With Connie Carpenter Phinney.  First Tuesday of each month, 2 pm  Check out additional information to Live Well Today on their website    Parkinson and Movement Disorders (PMD) Alliance:  www.pmdalliance.org  [employer]:  Online Education Events  Sign up for emails, which are sent weekly to give you updates on programming and online offerings    Parkinson's Association of the [place]:  www.parkinsonassociation.org  Information on online support groups, education events, and online exercises including Yoga, Parkinson's exercises and more-LOTS of information on   links to PD resources and online events  Virtual Support Group through Parkinson's Association  of the [place]; next one is scheduled for Wednesday, December 6th  at 2 pm.  (These are typically scheduled for the 1st Wednesday of the month at 2 pm).  Visit website for details.   MOVEMENT AND EXERCISE OPPORTUNITIES  PWR! Moves Classes at [place].  Wednesdays 10 and 11 am.   Contact Amy Marriott, PT [email or web address] if interested.  NEW PWR! Moves Class offerings at [employer].  *TUESDAYS* and Wednesdays 1-2 pm.    Contact Vonna Kotyk at  [email or web address]    Parkinson's Wellness Recovery (PWR! Moves)  www.pwr4life.org  Info on the PWR! Virtual Experience:  You will have access to our expertise?through self-assessment, guided plans that start with the PD-specific fundamentals, educational content, tips, Q&A with an expert, and a growing Art therapist of PD-specific pre-recorded and live exercise classes of varying types and intensity - both physical and cognitive! If that is not enough, we offer 1:1 wellness consultations (in-person or virtual) to personalize your PWR! Research scientist (medical).   [place] Fridays:   As part of the PD Health @ Home program, this free video series focuses each week on one aspect of fitness designed to support people living with Parkinson's.? These weekly videos highlight the [place] fitness guidelines for people with Parkinson's disease.  ModemGamers.[email or web address]   Dance for PD website is offering free, live-stream classes throughout the week, as well as links to [employer] of classes:  [email or web address].[email or web address]  Virtual dance and Pilates for Parkinson's classes: Click on the Community Tab> Parkinson's Movement Initiative Tab.  To register for classes and for more information, visit [email or web address] and click the "community" tab.   [employer] Parkinson's Cycling Classes   [facility]:  Thursdays @ Noon-Live classes at [employer] ([employer] [facility] [email or web address]?or [phone])  [facility]: Virtual Classes Mondays and Thursdays Jeanette Caprice classes Tuesday, Wednesday and Thursday (contact [place] at [email or web address] ?or [phone])  [place]  Varied levels of classes are offered Tuesdays and Thursdays at [employer].   Stretching with Verdis Frederickson weekly class is also offered for people with Parkinson's  To observe a class or for more information, call [phone] or email Hezzie Bump at [email or web address]   ADDITIONAL SUPPORT AND RESOURCES  [employer]:Online Caregiver Education Opportunities:  www.well-springsolutions.org/caregiver-education/caregiver-support-group.  You may also contact Vickki Muff at [email or web address] or [phone].     [employer] Navigator:  Just1Navigator program, a?free service to help individuals and families through the journey of determining care for older adults.  The "Navigator" is a Education officer, museum, Arnell Asal, who will speak with a prospective client and/or loved ones to provide an assessment of the situation and a set of recommendations for a personalized care plan -- all free of charge, and whether?[employer] offers the needed service or not. If the need is not a service we provide, we are well-connected with reputable programs in town that we can refer you to.  www.well-springsolutions.org or to speak with the Navigator, call [phone].

## 2022-12-01 ENCOUNTER — Telehealth: Payer: Self-pay | Admitting: Anesthesiology

## 2022-12-01 NOTE — Telephone Encounter (Signed)
Pt's husband Clair Gulling called stating that 3 weeks ago he refilled pt's Carbidopa-Levodopa ER from [facility]. The medication was a different color the pill used to be a tan color now its blue, ever since pt has been having diarrhea on and off x 3 weeks now. Pt's husband would like to know if the medication might be causing this.

## 2022-12-01 NOTE — Telephone Encounter (Signed)
Called [facility] and they gave me information that they have the correct medication and that it is the generic from [place] and the last shipment was from [facility]

## 2022-12-02 ENCOUNTER — Other Ambulatory Visit: Payer: Self-pay | Admitting: Neurology

## 2022-12-08 ENCOUNTER — Telehealth: Payer: Self-pay | Admitting: Neurology

## 2022-12-08 DIAGNOSIS — G20A1 Parkinson's disease without dyskinesia, without mention of fluctuations: Secondary | ICD-10-CM

## 2022-12-08 MED ORDER — CARBIDOPA-LEVODOPA ER 25-100 MG PO TBCR: EXTENDED_RELEASE_TABLET | ORAL | 1 refills | Status: DC

## 2022-12-08 NOTE — Telephone Encounter (Signed)
1. Which medications need refilled? (List name and dosage, if known) carbidopa-levodopa  2. Which pharmacy/location is medication to be sent to? (include street and city if local pharmacy) [facility]  90 day supply

## 2022-12-08 NOTE — Telephone Encounter (Signed)
Refill sent in for pt. 

## 2023-01-26 ENCOUNTER — Telehealth: Payer: Self-pay | Admitting: Neurology

## 2023-01-26 NOTE — Telephone Encounter (Signed)
Called Daughter back and gave her medical directions for dosage

## 2023-01-26 NOTE — Telephone Encounter (Signed)
Patient daughter wants to make sure she is giving the patient the correct dosage of medication daily. She is having to give the patient the medication at this time as patient husband is in the hospital. She wants to know which medication Tat has her on and how to take it. She states that you can leave her a very detail message if she does  not answer or if you will call her right back she will pick it up the call.  She thinks patient is on the carbidopa levodopa and clonazepam  with Dr Carles Collet

## 2023-02-05 DIAGNOSIS — N1831 Chronic kidney disease, stage 3a: Secondary | ICD-10-CM | POA: Diagnosis not present

## 2023-02-05 DIAGNOSIS — R7303 Prediabetes: Secondary | ICD-10-CM | POA: Diagnosis not present

## 2023-02-05 DIAGNOSIS — R413 Other amnesia: Secondary | ICD-10-CM | POA: Diagnosis not present

## 2023-02-05 DIAGNOSIS — L821 Other seborrheic keratosis: Secondary | ICD-10-CM | POA: Diagnosis not present

## 2023-02-05 DIAGNOSIS — M810 Age-related osteoporosis without current pathological fracture: Secondary | ICD-10-CM | POA: Diagnosis not present

## 2023-02-05 DIAGNOSIS — D539 Nutritional anemia, unspecified: Secondary | ICD-10-CM | POA: Diagnosis not present

## 2023-02-05 DIAGNOSIS — I10 Essential (primary) hypertension: Secondary | ICD-10-CM | POA: Diagnosis not present

## 2023-02-05 DIAGNOSIS — E785 Hyperlipidemia, unspecified: Secondary | ICD-10-CM | POA: Diagnosis not present

## 2023-04-17 ENCOUNTER — Other Ambulatory Visit: Payer: Self-pay

## 2023-04-17 ENCOUNTER — Telehealth: Payer: Self-pay | Admitting: Neurology

## 2023-04-17 DIAGNOSIS — G20A1 Parkinson's disease without dyskinesia, without mention of fluctuations: Secondary | ICD-10-CM

## 2023-04-17 MED ORDER — CARBIDOPA-LEVODOPA ER 25-100 MG PO TBCR
EXTENDED_RELEASE_TABLET | ORAL | 0 refills | Status: DC
Start: 2023-04-17 — End: 2023-07-21

## 2023-04-17 NOTE — Telephone Encounter (Signed)
Patient daughter needs to know how to order the Carbidopa levodopa from [place].  He dad asked her to call us about this as he is getting confused as he is in Hospice care

## 2023-04-17 NOTE — Telephone Encounter (Signed)
Refill sent in for pt she has a weeks worth of medication on hand

## 2023-04-17 NOTE — Telephone Encounter (Signed)
Refill sent in for pt she has about a weeks worth of medication left

## 2023-04-21 NOTE — Telephone Encounter (Signed)
F/u   Daughter calling checking on previous messages does mom medication box for two weeks and next Thursday she will be out of medication   Asking for call back

## 2023-04-21 NOTE — Progress Notes (Signed)
Assessment/Plan:   1.  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    -Patient has been now on hospice, but daughter states that he actually is getting better and thinks he will recover.  For now, daughter is assisting with caregiving.  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  We also and she  2.  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  Several times she has tried to go up to 1 tablet at bedtime, but ultimately ended up coming back down to half a tablet because of hangover effect.  PDMP is reviewed.  Last refill was March 04, 2023  3.  History of mental status change, possible seizure  -Presented to the hospital in October, 2020 with mental status change, receptive aphasia and hemianopsia.  Received TPA.  MRI was negative.  EEG with transient sharps in the left temporal region, felt to be a normal variant.  Patient was nonetheless started on Keppra, which had to be decreased because of side effects.  They ended up self discontinuing the Keppra and she has done okay.   Subjective:   Belinda Davis was seen today in follow up for Parkinsons disease.  My previous records were reviewed prior to todays visit as well as outside records available to me.  Daughter accompanies the patient and supplements history.  Unfortunately, her husband is ill and in hospice now but daughter states that he is actually doing much better and is recovering from the pneumonia.  Pt had one fall.  It was a few weeks ago.  She tripped over a stair and fell.  Its been a bit swollen but patient denies any pain.  Pt states that both feet swell so she doesn't think its related to the fall.  Daughter is caregiving and is on her last day of FMLA.    Current prescribed movement disorder medications: Clonazepam 0.5 mg, half  tablet at bedtime (has been on higher dosages with hangover effect) Carbidopa/levodopa 25/100 CR, 1 tablet at 8 AM/noon/4 PM/8 PM   Prior meds:  buspar, 5mg  (felt loopy but only took 1 or 2 tablets); Keppra (tolerated well at low dose)  ALLERGIES:  No Known Allergies  CURRENT MEDICATIONS:  Outpatient Encounter Medications as of 05/01/2023  Medication Sig   amLODipine (NORVASC) 5 MG tablet Take 5 mg by mouth daily.   aspirin EC 81 MG EC tablet Take 1 tablet (81 mg total) by mouth daily.   Carbidopa-Levodopa ER (SINEMET CR) 25-100 MG tablet controlled release TAKE 1 TABLET BY MOUTH 4 TIMES DAILY AT  8AM,  NOON,  4PM  AND  8PM   Cholecalciferol (VITAMIN D-3 PO) Take 1 capsule by mouth at bedtime.   clonazePAM (KLONOPIN) 0.5 MG tablet TAKE 1/2 (ONE-HALF) TABLET BY MOUTH AT BEDTIME   hydrochlorothiazide (HYDRODIURIL) 12.5 MG tablet Take 12.5 mg by mouth daily.   losartan (COZAAR) 100 MG tablet Take 100 mg by mouth at bedtime.    pravastatin (PRAVACHOL) 40 MG tablet Take 40 mg by mouth at bedtime.    No facility-administered encounter medications on file as of 05/01/2023.    Objective:   PHYSICAL EXAMINATION:    VITALS:   Vitals:   05/01/23 1459  BP: 118/76  Pulse: 72  SpO2: 96%  Weight: 150 lb 12.8 oz (68.4  kg)   GEN:  The patient appears stated age and is in NAD. HEENT:  Normocephalic, atraumatic.  The mucous membranes are moist. The superficial temporal arteries are without ropiness or tenderness. CV:  RRR Lungs:  CTAB Neck/HEME:  There are no carotid bruits bilaterally. Musculoskeletal: Patient in compression hose.  No lower extremity edema.  Neurological examination:  Orientation: The patient is alert and oriented to person and place.  Remembers this examiner again and asks about her children Cranial nerves: There is good facial symmetry with mild facial hypomimia. The speech is fluent and clear. Soft palate rises symmetrically and there is no tongue deviation. Hearing is  intact to conversational tone. Sensation: Sensation is intact to light touch throughout Motor: Strength is 5/5 in the UE/LE.  Ankle strength is good.  Movement examination: Tone: There is normal tone in the upper and lower extremities. Abnormal movements: There is rare independent mild bilateral UE tremor (thumbs only) Coordination:  There is no decremation with any form of RAMS, including alternating supination and pronation of the forearm, hand opening and closing, finger taps, heel taps and toe taps.  Gait and Station: The patient pushes off to arise.  She is slightly forward flexed.  Walks fairly well.  This is really stable from prior examination.  I have reviewed and interpreted the following labs independently    Chemistry      Component Value Date/Time   NA 142 10/04/2019 0855   K 4.5 10/04/2019 0855   CL 107 10/04/2019 0855   CO2 25 10/04/2019 0855   BUN 21 10/04/2019 0855   CREATININE 1.47 (H) 10/04/2019 0855      Component Value Date/Time   CALCIUM 9.5 10/04/2019 0855   ALKPHOS 58 09/13/2019 1822   AST 14 10/04/2019 0855   ALT 7 10/04/2019 0855   BILITOT 0.8 10/04/2019 0855       Lab Results  Component Value Date   WBC 5.4 10/04/2019   HGB 11.6 (L) 10/04/2019   HCT 35.2 10/04/2019   MCV 95.4 10/04/2019   PLT 199 10/04/2019    No results found for: "TSH"     Cc:  Moon, Amy A, NP

## 2023-04-21 NOTE — Telephone Encounter (Signed)
Called patients daughter and let her know Heather sent this prescription on Friday and gave her [facility] phone umber in case she wants to call to check on it

## 2023-05-01 ENCOUNTER — Encounter: Payer: Self-pay | Admitting: Neurology

## 2023-05-01 ENCOUNTER — Ambulatory Visit (INDEPENDENT_AMBULATORY_CARE_PROVIDER_SITE_OTHER): Payer: PPO | Admitting: Neurology

## 2023-05-01 VITALS — BP 118/76 | HR 72 | Wt 150.8 lb

## 2023-05-01 DIAGNOSIS — F0280 Dementia in other diseases classified elsewhere without behavioral disturbance: Secondary | ICD-10-CM | POA: Diagnosis not present

## 2023-05-01 DIAGNOSIS — G20A1 Parkinson's disease without dyskinesia, without mention of fluctuations: Secondary | ICD-10-CM

## 2023-05-01 NOTE — Patient Instructions (Signed)
Local and Online Resources for Power over Parkinson's Group  June 2024   LOCAL [place] PARKINSON'S GROUPS   Power over Parkinson's Group:    Power Over Parkinson's Patient Education Group will be Wednesday, JULY 10th-*PLEASE NOTE:  We will not be having a June meeting.  Our next meeting will be in July.  We apologize for the inconvenience.   Power over Parkinson's and Care Partner Groups will meet together, with plans for separate break out session for caregivers, depending on topic/speaker Upcoming Power over Parkinson's Meetings/Care Partner Support:  2nd Wednesdays of the month at 2 pm:   No regular June meeting, July 10th  Contact Amy Marriott at [email or web address] or Sarah Chambers at [email or web address] if interested in participating in this group    LOCAL EVENTS AND NEW OFFERINGS  PLEASE NOTE:  There will be no June Power over Parkinson's meeting (NO MEETING June 12th) Picnic Party for Parkinson's.  [place] [facility] Movement Disorders Program Celebration.  June 19th 1-3 pm.  Contact Sarah Chambers at [email or web address] Parkinson's Social Game Night.  First Thursday of each month, 2:00-4:00 pm.  *Next date is June 6th*.  [facility], [place].  Contact [email or web address] if interested. Parkinson's CarePartner Group for Men is in the works, if interested email Sarah  [email or web address] ACT [employer] Chair Yoga classes "Train and Gain", Fridays 10 am, [employer].  Contact Gina at [phone].  Community Fitness Instructor-Led Parkinson's Exercises Classes offering at [employer]!  TUESDAYS (Chair Yoga)  and Wednesdays (PWR! Moves)  1:00 pm.   Contact Christy Weaver at  [email or web address]  or [phone]  [place] PWR! Moves classes.  Thursdays at 11:45, [employer].  Free to participate through Parkinson Foundation grant.  Contact Sarah Chambers at [email or web address] or  [phone] to register Drumming for Parkinson's will be held on 2nd and 4th Mondays at 11:00 am.   Located at the [employer] ([address] [place].)  Contact Jane Maydian at [email or web address] or [phone]  [employer] Parkinson's Tai Chi Class, Mondays at 11 am.  Call [phone] for details   ONLINE EDUCATION AND SUPPORT  Parkinson Foundation:  www.parkinson.org  PD Health at Home continues:  Mindfulness Mondays, Wellness Wednesdays, Fitness Fridays  (PWR! Moves as part of Fitness Fridays March 22nd, 1-1:45 pm) Upcoming Education:   Current and Emerging Methods to Aid Parkinson's Diagnosis.  Wednesday, May 29th, 1-2 pm Recognize and Respond to Parkinson's Psychosis.  Wednesday, June 5th, 1-2 pm Parkinsonisms.  Wednesday, June 12th, 1-2 pm Expert Briefing:  Addressing the Challenge of Apathy in Parkinson's.  Wednesday, September 11th, 1-2 pm. Register for virtual education and expert briefings (webinars) at [email or web address]-support/[email or web address]-education Please check out their website to sign up for emails and see their full online offerings     Michael [employer] Foundation:  www.michaeljfox.org   Third Thursday Webinars:  On the third Thursday of every month at 12 p.m. ET, join our free live webinars to learn about various aspects of living with Parkinson's disease and our work to speed medical breakthroughs.  Upcoming Webinar:  You Want to Volunteer for Parkinson's Research: Now What?  Thursday, June 20th at 12 noon. Check out additional information on their website to see their full online offerings    Davis Phinney Foundation:  www.davisphinneyfoundation.org  Upcoming Webinar:  Living Safely with Parkinson's Inside and Outside of your Home.  Thursday, June 13th , 3 pm Series:  Living with Parkinson's Meetup.   Third Thursdays each month, 3 pm    Care Partner Monthly Meetup.  With Connie Carpenter Phinney.  First Tuesday of each month, 2  pm  Check out additional information to Live Well Today on their website    Parkinson and Movement Disorders (PMD) Alliance:  www.pmdalliance.org  [employer]:  Online Education Events  Sign up for emails, which are sent weekly to give you updates on programming and online offerings    Parkinson's Association of [place]:  www.parkinsonassociation.org  Information on online support groups, education events, and online exercises including Yoga, Parkinson's exercises and more-LOTS of information on links to PD resources and online events  Virtual Support Group through Parkinson's Association of [place]; next one is scheduled for Wednesday, June 5th   MOVEMENT AND EXERCISE OPPORTUNITIES  Parkinson's Exercise Class offerings at [employer]. *TUESDAYS* (Chair yoga) and Wednesdays (PWR! Moves)  1:00 pm.   *Please note that PWR! Moves [place] class has ended.  Please consider trying PWR! Moves on Wednesdays at 1 pm at [employer].  Contact Christy Weaver at [email or web address]    Parkinson's Wellness Recovery (PWR! Moves)  www.pwr4life.org  Info on the PWR! Virtual Experience:  You will have access to our expertise?through self-assessment, guided plans that start with the PD-specific fundamentals, educational content, tips, Q&A with an expert, and a growing library of PD-specific pre-recorded and live exercise classes of varying types and intensity - both physical and cognitive! If that is not enough, we offer 1:1 wellness consultations (in-person or virtual) to personalize your PWR! Virtual Experience.   Parkinson Foundation Fitness Fridays:   As part of the PD Health @ Home program, this free video series focuses each week on one aspect of fitness designed to support people living with Parkinson's.? These weekly videos highlight the Parkinson Foundation fitness guidelines for people with Parkinson's disease.   www.parkinson.org/resources-support/online-education/pdhealth#ff  Dance for PD website is offering free, live-stream classes throughout the week, as well as links to digital library of classes:  https://danceforparkinsons.org/  Virtual dance and Pilates for Parkinson's classes: Click on the Community Tab> Parkinson's Movement Initiative Tab.  To register for classes and for more information, visit www.americandancefestival.org and click the "community" tab.   [employer] Parkinson's Cycling Classes   [employer]:  Thursdays @ Noon-Live classes at [employer] (Contact Georgine Hazen at [email or web address]?or [phone])  [employer]: Classes Tuesday, Wednesday and Thursday (contact Marlee at [email or web address] ?or [phone])  [place] [employer]ing  Varied levels of classes are offered Tuesdays and Thursdays at [employer] [facility].   Stretching with Maria weekly class is also offered for people with Parkinson's  To observe a class or for more information, call [phone] or email Hillary Savage at [email or web address]   ADDITIONAL SUPPORT AND RESOURCES  [employer]:  Online Caregiver Education Opportunities:  www.well-springsolutions.org/caregiver-education/caregiver-support-group.  You may also contact Jodi Kolada at [email or web address] or [phone].     [employer] Navigator:  Just1Navigator program, a?free service to help individuals and families through the journey of determining care for older adults.  The "Navigator" is a social worker, Nicole Reynolds, who will speak with a prospective client and/or loved ones to provide an assessment of the situation and a set of recommendations for a personalized care plan -- all free of charge, and whether?[employer] offers the needed service or not. If the need is not a service we provide, we are well-connected with reputable programs in town that we can refer you to.   www.well-springsolutions.org or to speak with the Navigator, call [phone].     

## 2023-05-12 ENCOUNTER — Ambulatory Visit: Payer: PPO | Admitting: Neurology

## 2023-06-02 ENCOUNTER — Other Ambulatory Visit: Payer: Self-pay | Admitting: Neurology

## 2023-06-08 DIAGNOSIS — G40909 Epilepsy, unspecified, not intractable, without status epilepticus: Secondary | ICD-10-CM | POA: Diagnosis not present

## 2023-06-08 DIAGNOSIS — R7303 Prediabetes: Secondary | ICD-10-CM | POA: Diagnosis not present

## 2023-06-08 DIAGNOSIS — E785 Hyperlipidemia, unspecified: Secondary | ICD-10-CM | POA: Diagnosis not present

## 2023-06-08 DIAGNOSIS — R413 Other amnesia: Secondary | ICD-10-CM | POA: Diagnosis not present

## 2023-06-08 DIAGNOSIS — N1831 Chronic kidney disease, stage 3a: Secondary | ICD-10-CM | POA: Diagnosis not present

## 2023-06-08 DIAGNOSIS — G20A1 Parkinson's disease without dyskinesia, without mention of fluctuations: Secondary | ICD-10-CM | POA: Diagnosis not present

## 2023-06-08 DIAGNOSIS — I10 Essential (primary) hypertension: Secondary | ICD-10-CM | POA: Diagnosis not present

## 2023-06-08 DIAGNOSIS — M810 Age-related osteoporosis without current pathological fracture: Secondary | ICD-10-CM | POA: Diagnosis not present

## 2023-06-08 DIAGNOSIS — D539 Nutritional anemia, unspecified: Secondary | ICD-10-CM | POA: Diagnosis not present

## 2023-07-21 ENCOUNTER — Telehealth: Payer: Self-pay | Admitting: Neurology

## 2023-07-21 ENCOUNTER — Other Ambulatory Visit: Payer: Self-pay

## 2023-07-21 DIAGNOSIS — G20A1 Parkinson's disease without dyskinesia, without mention of fluctuations: Secondary | ICD-10-CM

## 2023-07-21 MED ORDER — CARBIDOPA-LEVODOPA ER 25-100 MG PO TBCR
EXTENDED_RELEASE_TABLET | ORAL | 0 refills | Status: DC
Start: 2023-07-21 — End: 2023-07-27

## 2023-07-21 NOTE — Telephone Encounter (Signed)
Pt's husband called stating that Carbidora-Levodopa 25-100mg  [facility] [phone]

## 2023-07-21 NOTE — Telephone Encounter (Signed)
RX sent

## 2023-07-27 ENCOUNTER — Telehealth: Payer: Self-pay | Admitting: Neurology

## 2023-07-27 ENCOUNTER — Other Ambulatory Visit: Payer: Self-pay

## 2023-07-27 DIAGNOSIS — G20A1 Parkinson's disease without dyskinesia, without mention of fluctuations: Secondary | ICD-10-CM

## 2023-07-27 MED ORDER — CARBIDOPA-LEVODOPA ER 25-100 MG PO TBCR
EXTENDED_RELEASE_TABLET | ORAL | 0 refills | Status: DC
Start: 2023-07-27 — End: 2023-10-21

## 2023-07-27 NOTE — Telephone Encounter (Signed)
Called [facility] [place] they are waiting for patient to log in and pay. Sent 10 days worth to [facility] in [place] to cover patient until meds are delivered

## 2023-07-27 NOTE — Telephone Encounter (Signed)
Pt's husband called in stating [facility] has not sent any carbidopa-levodopa to them and the pt will run out on Wednesday. He wanted to double check that we sent everything to the pharmacy?

## 2023-10-12 DIAGNOSIS — E785 Hyperlipidemia, unspecified: Secondary | ICD-10-CM | POA: Diagnosis not present

## 2023-10-12 DIAGNOSIS — G40909 Epilepsy, unspecified, not intractable, without status epilepticus: Secondary | ICD-10-CM | POA: Diagnosis not present

## 2023-10-12 DIAGNOSIS — R413 Other amnesia: Secondary | ICD-10-CM | POA: Diagnosis not present

## 2023-10-12 DIAGNOSIS — N1831 Chronic kidney disease, stage 3a: Secondary | ICD-10-CM | POA: Diagnosis not present

## 2023-10-12 DIAGNOSIS — I10 Essential (primary) hypertension: Secondary | ICD-10-CM | POA: Diagnosis not present

## 2023-10-12 DIAGNOSIS — R7303 Prediabetes: Secondary | ICD-10-CM | POA: Diagnosis not present

## 2023-10-12 DIAGNOSIS — M810 Age-related osteoporosis without current pathological fracture: Secondary | ICD-10-CM | POA: Diagnosis not present

## 2023-10-12 DIAGNOSIS — G20A1 Parkinson's disease without dyskinesia, without mention of fluctuations: Secondary | ICD-10-CM | POA: Diagnosis not present

## 2023-10-12 DIAGNOSIS — Z23 Encounter for immunization: Secondary | ICD-10-CM | POA: Diagnosis not present

## 2023-10-12 DIAGNOSIS — D539 Nutritional anemia, unspecified: Secondary | ICD-10-CM | POA: Diagnosis not present

## 2023-10-21 ENCOUNTER — Telehealth: Payer: Self-pay | Admitting: Neurology

## 2023-10-21 ENCOUNTER — Other Ambulatory Visit: Payer: Self-pay

## 2023-10-21 DIAGNOSIS — G20A1 Parkinson's disease without dyskinesia, without mention of fluctuations: Secondary | ICD-10-CM

## 2023-10-21 MED ORDER — CARBIDOPA-LEVODOPA ER 25-100 MG PO TBCR
EXTENDED_RELEASE_TABLET | ORAL | 0 refills | Status: DC
Start: 2023-10-21 — End: 2024-01-12

## 2023-10-21 NOTE — Telephone Encounter (Signed)
1. Which medications need to be refilled? (please list name of each medication and dose if known) Carbidora-Levodopa 25-100mg    2. Which pharmacy/location (including street and city if local pharmacy) is medication to be sent to?   3. Do they need a 30 day or 90 day supply?  Pt's husband called in stating wife needs refills of carbidopa-levodopa. [facility]ated [facility]?

## 2023-11-02 NOTE — Progress Notes (Signed)
Assessment/Plan:   1.  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    -increase water intake to see if helps q hs cramping.  If not, we will increase q hs levodopa.  Her cramping is only 2 times per week.  2.  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  Several times she has tried to go up to 1 tablet at bedtime, but ultimately ended up coming back down to half a tablet because of hangover effect.  PDMP is reviewed.  Last refill was September 01, 2023 for a 90-day supply.  Refill today for convenience sake, but asked pharmacy to put it on file until she is ready.  3.  History of mental status change, possible seizure  -Presented to the hospital in October, 2020 with mental status change, receptive aphasia and hemianopsia.  Received TPA.  MRI was negative.  EEG with transient sharps in the left temporal region, felt to be a normal variant.  Patient was nonetheless started on Keppra, which had to be decreased because of side effects.  They ended up self discontinuing the Keppra and she has done okay.   Subjective:   Belinda Davis was seen today in follow up for Parkinsons disease.  My previous records were reviewed prior to todays visit as well as outside records available to me.  spouse accompanies the patient and supplements history.  Her husband had been on hospice, but had actually been recovering and doing better last visit.  He is back to normal and off of hospice.  Daughter had been assisting with much of the caregiving but she was able to get back to work.   Patient remains on levodopa and seems to be doing well with that.  She has had no falls.  No lightheadedness or near syncope.  She is really doing well.  She has some leg pains when she is getting in bed about 2 nights per week.    Current prescribed movement disorder medications: Clonazepam 0.5 mg, half tablet at bedtime  (has been on higher dosages with hangover effect) Carbidopa/levodopa 25/100 CR, 1 tablet at 8 AM/noon/4 PM/8 PM   Prior meds:  buspar, 5mg  (felt loopy but only took 1 or 2 tablets); Keppra (tolerated well at low dose)  ALLERGIES:  No Known Allergies  CURRENT MEDICATIONS:  Outpatient Encounter Medications as of 11/03/2023  Medication Sig   amLODipine (NORVASC) 5 MG tablet Take 5 mg by mouth daily.   aspirin EC 81 MG EC tablet Take 1 tablet (81 mg total) by mouth daily.   Carbidopa-Levodopa ER (SINEMET CR) 25-100 MG tablet controlled release TAKE 1 TABLET BY MOUTH 4 TIMES DAILY AT  8AM,  NOON,  4PM  AND  8PM   Cholecalciferol (VITAMIN D-3 PO) Take 1 capsule by mouth at bedtime.   hydrochlorothiazide (HYDRODIURIL) 12.5 MG tablet Take 12.5 mg by mouth daily.   losartan (COZAAR) 100 MG tablet Take 100 mg by mouth at bedtime.    pravastatin (PRAVACHOL) 40 MG tablet Take 40 mg by mouth at bedtime.    [DISCONTINUED] clonazePAM (KLONOPIN) 0.5 MG tablet TAKE 1/2 (ONE-HALF) TABLET BY MOUTH AT BEDTIME   clonazePAM (KLONOPIN) 0.5 MG tablet TAKE 1/2 (ONE-HALF) TABLET BY MOUTH AT BEDTIME   No facility-administered encounter medications on file as of 11/03/2023.    Objective:   PHYSICAL EXAMINATION:    VITALS:   Vitals:  11/03/23 1447  BP: 116/60  Pulse: 69  SpO2: 98%  Weight: 153 lb (69.4 kg)  Height: 5\' 2"  (1.575 m)    GEN:  The patient appears stated age and is in NAD. HEENT:  Normocephalic, atraumatic.  The mucous membranes are moist. The superficial temporal arteries are without ropiness or tenderness. CV:  RRR Lungs:  CTAB Neck/HEME:  There are no carotid bruits bilaterally. Musculoskeletal: Patient in compression hose.  No lower extremity edema.  Neurological examination:  Orientation: The patient is alert and oriented to person and place.  Remembers this examiner again and asks about her children Cranial nerves: There is good facial symmetry with mild facial hypomimia. The  speech is fluent and clear. Soft palate rises symmetrically and there is no tongue deviation. Hearing is intact to conversational tone. Sensation: Sensation is intact to light touch throughout Motor: Strength is 5/5 in the UE/LE.  Ankle strength is good.  Movement examination: Tone: There is normal tone in the upper and lower extremities. Abnormal movements: There is LUE rest tremor, very mild Coordination:  There is no decremation with any form of RAMS, including alternating supination and pronation of the forearm, hand opening and closing, finger taps, heel taps and toe taps.  Gait and Station: The patient pushes off to arise.  She is slightly forward flexed.  Walks fairly well.  This is really stable from prior examination.  I have reviewed and interpreted the following labs independently    Chemistry      Component Value Date/Time   NA 142 10/04/2019 0855   K 4.5 10/04/2019 0855   CL 107 10/04/2019 0855   CO2 25 10/04/2019 0855   BUN 21 10/04/2019 0855   CREATININE 1.47 (H) 10/04/2019 0855      Component Value Date/Time   CALCIUM 9.5 10/04/2019 0855   ALKPHOS 58 09/13/2019 1822   AST 14 10/04/2019 0855   ALT 7 10/04/2019 0855   BILITOT 0.8 10/04/2019 0855       Lab Results  Component Value Date   WBC 5.4 10/04/2019   HGB 11.6 (L) 10/04/2019   HCT 35.2 10/04/2019   MCV 95.4 10/04/2019   PLT 199 10/04/2019    No results found for: "TSH"  Total time spent on today's visit was 22 minutes, including both face-to-face time and nonface-to-face time.  Time included that spent on review of records (prior notes available to me/labs/imaging if pertinent), discussing treatment and goals, answering patient's questions and coordinating care.    Cc:  Hurshel Party, NP

## 2023-11-03 ENCOUNTER — Encounter: Payer: Self-pay | Admitting: Neurology

## 2023-11-03 ENCOUNTER — Ambulatory Visit: Payer: PPO | Admitting: Neurology

## 2023-11-03 VITALS — BP 116/60 | HR 69 | Ht 62.0 in | Wt 153.0 lb

## 2023-11-03 DIAGNOSIS — G20A1 Parkinson's disease without dyskinesia, without mention of fluctuations: Secondary | ICD-10-CM | POA: Diagnosis not present

## 2023-11-03 MED ORDER — CLONAZEPAM 0.5 MG PO TABS: ORAL_TABLET | ORAL | 1 refills | Status: DC

## 2023-12-03 ENCOUNTER — Telehealth: Payer: Self-pay | Admitting: Neurology

## 2023-12-03 NOTE — Telephone Encounter (Signed)
Patient Husband called would like to know if we can help (Rx) as Patient is not sleeping well.

## 2023-12-04 NOTE — Telephone Encounter (Signed)
Called and spoke to patients husband to try melatonin and also call PCP for advice about sleep

## 2023-12-04 NOTE — Telephone Encounter (Signed)
Last notes indicate you have tried to give her more clonazapam  -Continue clonazepam 0.5 mg, half tablet at bedtime. Several times she has tried to go up to 1 tablet at bedtime, but ultimately ended up coming back down to half a tablet because of hangover effect.

## 2024-01-08 ENCOUNTER — Telehealth: Payer: Self-pay | Admitting: Neurology

## 2024-01-08 DIAGNOSIS — G20A1 Parkinson's disease without dyskinesia, without mention of fluctuations: Secondary | ICD-10-CM

## 2024-01-08 NOTE — Telephone Encounter (Signed)
Pt's husband called in wanting to ask if it was ok if the patient takes Prevogen?

## 2024-01-08 NOTE — Telephone Encounter (Signed)
Patients  husband asking if there is anything you do recommend for this patients memory to help?

## 2024-01-12 MED ORDER — CARBIDOPA-LEVODOPA ER 25-100 MG PO TBCR: EXTENDED_RELEASE_TABLET | ORAL | 1 refills | Status: DC

## 2024-01-12 MED ORDER — DONEPEZIL HCL 5 MG PO TABS: 5.0000 mg | ORAL_TABLET | Freq: Every day | ORAL | 1 refills | Status: DC

## 2024-01-12 NOTE — Telephone Encounter (Signed)
 Called and spoke to patients husband and he would like to trial a low dose of donepezil, 5 mg daily for patient and would like this sent to patients [facility].  Also, patients husband stated that patient is needing a refill of carbidopa levodopa sent to [facility].

## 2024-02-09 DIAGNOSIS — N1831 Chronic kidney disease, stage 3a: Secondary | ICD-10-CM | POA: Diagnosis not present

## 2024-02-09 DIAGNOSIS — R7303 Prediabetes: Secondary | ICD-10-CM | POA: Diagnosis not present

## 2024-02-09 DIAGNOSIS — D539 Nutritional anemia, unspecified: Secondary | ICD-10-CM | POA: Diagnosis not present

## 2024-02-09 DIAGNOSIS — M810 Age-related osteoporosis without current pathological fracture: Secondary | ICD-10-CM | POA: Diagnosis not present

## 2024-02-09 DIAGNOSIS — G40909 Epilepsy, unspecified, not intractable, without status epilepticus: Secondary | ICD-10-CM | POA: Diagnosis not present

## 2024-02-09 DIAGNOSIS — G20A1 Parkinson's disease without dyskinesia, without mention of fluctuations: Secondary | ICD-10-CM | POA: Diagnosis not present

## 2024-02-09 DIAGNOSIS — E785 Hyperlipidemia, unspecified: Secondary | ICD-10-CM | POA: Diagnosis not present

## 2024-02-09 DIAGNOSIS — I10 Essential (primary) hypertension: Secondary | ICD-10-CM | POA: Diagnosis not present

## 2024-02-09 DIAGNOSIS — R413 Other amnesia: Secondary | ICD-10-CM | POA: Diagnosis not present

## 2024-02-09 DIAGNOSIS — Z6827 Body mass index (BMI) 27.0-27.9, adult: Secondary | ICD-10-CM | POA: Diagnosis not present

## 2024-04-22 ENCOUNTER — Other Ambulatory Visit: Payer: Self-pay | Admitting: Neurology

## 2024-04-22 ENCOUNTER — Telehealth: Payer: Self-pay | Admitting: Neurology

## 2024-04-22 DIAGNOSIS — G20A1 Parkinson's disease without dyskinesia, without mention of fluctuations: Secondary | ICD-10-CM

## 2024-04-22 MED ORDER — CARBIDOPA-LEVODOPA ER 25-100 MG PO TBCR: EXTENDED_RELEASE_TABLET | ORAL | 0 refills | Status: DC

## 2024-04-22 MED ORDER — CARBIDOPA-LEVODOPA ER 25-100 MG PO TBCR
EXTENDED_RELEASE_TABLET | ORAL | 0 refills | Status: DC
Start: 2024-04-22 — End: 2024-07-21

## 2024-04-22 NOTE — Telephone Encounter (Signed)
 Pt's husband called in stating the carbidopa  levodopa  was sent to [facility] in error. He needs it to go to [facility]/ [address]

## 2024-04-22 NOTE — Telephone Encounter (Signed)
 Refill sent to [facility] [place]

## 2024-04-22 NOTE — Telephone Encounter (Signed)
 1. Which medications need refilled? (List name and dosage, if known) carbidopa -levodopa   2. Which pharmacy/location is medication to be sent to? (include street and city if local pharmacy) [facility]

## 2024-04-26 ENCOUNTER — Telehealth: Payer: Self-pay | Admitting: Neurology

## 2024-04-26 NOTE — Telephone Encounter (Signed)
 Pt husband said he missed a call from someone, he's returning the call

## 2024-04-26 NOTE — Telephone Encounter (Signed)
 Called patient to let him know that his refill has been sent to [place]

## 2024-05-02 NOTE — Progress Notes (Signed)
 Assessment/Plan:   1.  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    -increase water intake to see if helps q hs cramping.  If not, we will increase q hs levodopa .  Her cramping is only 2 times per week.  - On donepezil   2.  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  Several times she has tried to go up to 1 tablet at bedtime, but ultimately ended up coming back down to half a tablet because of hangover effect.  PDMP is reviewed.  Last refill was September 01, 2023 for a 90-day supply.  Refill today for convenience sake, but asked pharmacy to put it on file until she is ready.  3.  History of mental status change, possible seizure  -Presented to the hospital in October, 2020 with mental status change, receptive aphasia and hemianopsia.  Received TPA.  MRI was negative.  EEG with transient sharps in the left temporal region, felt to be a normal variant.  Patient was nonetheless started on Keppra , which had to be decreased because of side effects.  They ended up self discontinuing the Keppra  and she has done okay.   Subjective:   Belinda Davis was seen today in follow up for Parkinsons disease.  My previous records were reviewed prior to todays visit as well as outside records available to me.  spouse accompanies the patient and supplements history.  Last visit, they talked about some occasional cramping at bedtime.  We talked about increasing water before we increased her levodopa .  Today, they state that ***.  I did get a call about a month after last saw her asking to increase her clonazepam , but I declined that because we had tried that several times in the past and she just get hangover effect.  I did tell them that they could add melatonin.  Separately, they called me about a month after they are asking about medication for memory.  Donepezil  was added.  Current prescribed  movement disorder medications: Clonazepam  0.5 mg, half tablet at bedtime (higher dosages with hangover effect) Carbidopa /levodopa  25/100 CR, 1 tablet at 8 AM/noon/4 PM/8 PM Physical, 5 mg daily  Prior meds:  buspar , 5mg  (felt loopy but only took 1 or 2 tablets); Keppra  (tolerated well at low dose)  ALLERGIES:  No Known Allergies  CURRENT MEDICATIONS:  Outpatient Encounter Medications as of 05/03/2024  Medication Sig   amLODipine (NORVASC) 5 MG tablet Take 5 mg by mouth daily.   aspirin  EC 81 MG EC tablet Take 1 tablet (81 mg total) by mouth daily.   Carbidopa -Levodopa  ER (SINEMET  CR) 25-100 MG tablet controlled release TAKE 1 TABLET BY MOUTH 4 TIMES DAILY AT  8AM,  NOON,  4PM  AND  8PM   Cholecalciferol (VITAMIN D-3 PO) Take 1 capsule by mouth at bedtime.   clonazePAM  (KLONOPIN ) 0.5 MG tablet TAKE 1/2 (ONE-HALF) TABLET BY MOUTH AT BEDTIME   donepezil  (ARICEPT ) 5 MG tablet Take 1 tablet (5 mg total) by mouth daily.   hydrochlorothiazide (HYDRODIURIL) 12.5 MG tablet Take 12.5 mg by mouth daily.   losartan (COZAAR) 100 MG tablet Take 100 mg by mouth at bedtime.    pravastatin  (PRAVACHOL ) 40 MG tablet Take 40 mg by mouth at bedtime.    No facility-administered encounter medications on file as of 05/03/2024.    Objective:   PHYSICAL EXAMINATION:    VITALS:  There were no vitals filed for this visit.   GEN:  The patient appears stated age and is in NAD. HEENT:  Normocephalic, atraumatic.  The mucous membranes are moist. The superficial temporal arteries are without ropiness or tenderness. CV:  RRR Lungs:  CTAB Neck/HEME:  There are no carotid bruits bilaterally. Musculoskeletal: Patient in compression hose.  No lower extremity edema.  Neurological examination:  Orientation: The patient is alert and oriented to person and place.  Remembers this examiner again and asks about her children Cranial nerves: There is good facial symmetry with mild facial hypomimia. The speech is fluent  and clear. Soft palate rises symmetrically and there is no tongue deviation. Hearing is intact to conversational tone. Sensation: Sensation is intact to light touch throughout Motor: Strength is 5/5 in the UE/LE.  Ankle strength is good.  Movement examination: Tone: There is normal tone in the upper and lower extremities. Abnormal movements: There is LUE rest tremor, very mild Coordination:  There is no decremation with any form of RAMS, including alternating supination and pronation of the forearm, hand opening and closing, finger taps, heel taps and toe taps.  Gait and Station: The patient pushes off to arise.  She is slightly forward flexed.  Walks fairly well.  This is really stable from prior examination.  I have reviewed and interpreted the following labs independently    Chemistry      Component Value Date/Time   NA 142 10/04/2019 0855   K 4.5 10/04/2019 0855   CL 107 10/04/2019 0855   CO2 25 10/04/2019 0855   BUN 21 10/04/2019 0855   CREATININE 1.47 (H) 10/04/2019 0855      Component Value Date/Time   CALCIUM 9.5 10/04/2019 0855   ALKPHOS 58 09/13/2019 1822   AST 14 10/04/2019 0855   ALT 7 10/04/2019 0855   BILITOT 0.8 10/04/2019 0855       Lab Results  Component Value Date   WBC 5.4 10/04/2019   HGB 11.6 (L) 10/04/2019   HCT 35.2 10/04/2019   MCV 95.4 10/04/2019   PLT 199 10/04/2019    No results found for: TSH  Total time spent on today's visit was *** minutes, including both face-to-face time and nonface-to-face time.  Time included that spent on review of records (prior notes available to me/labs/imaging if pertinent), discussing treatment and goals, answering patient's questions and coordinating care.    Cc:  Olga Berthold, NP

## 2024-05-03 ENCOUNTER — Ambulatory Visit: Payer: PPO | Admitting: Neurology

## 2024-05-03 VITALS — BP 132/82 | HR 63 | Ht 62.0 in | Wt 149.2 lb

## 2024-05-03 DIAGNOSIS — G20A1 Parkinson's disease without dyskinesia, without mention of fluctuations: Secondary | ICD-10-CM

## 2024-05-03 DIAGNOSIS — F0280 Dementia in other diseases classified elsewhere without behavioral disturbance: Secondary | ICD-10-CM

## 2024-05-03 NOTE — Patient Instructions (Signed)
 SAVE THE DATE!  We are planning a Parkinsons Disease educational symposium at [facility] in [place] on September 19.  More details to come!  We will have a movement disorder physician expert from [employer] coming to speak and a caregiver speaker.  We will have a panel of experts that will show you who you may need on your team of people on your journey with Parkinsons.  If you would like to be added to our email list to get further information, email [email or web address].  I hope to see you there!

## 2024-05-09 DIAGNOSIS — L299 Pruritus, unspecified: Secondary | ICD-10-CM | POA: Diagnosis not present

## 2024-05-13 ENCOUNTER — Other Ambulatory Visit: Payer: Self-pay | Admitting: Neurology

## 2024-05-20 ENCOUNTER — Encounter: Payer: Self-pay | Admitting: Neurology

## 2024-05-24 ENCOUNTER — Other Ambulatory Visit: Payer: Self-pay

## 2024-05-24 DIAGNOSIS — R413 Other amnesia: Secondary | ICD-10-CM

## 2024-05-24 DIAGNOSIS — G20A1 Parkinson's disease without dyskinesia, without mention of fluctuations: Secondary | ICD-10-CM

## 2024-06-10 DIAGNOSIS — F0280 Dementia in other diseases classified elsewhere without behavioral disturbance: Secondary | ICD-10-CM | POA: Diagnosis not present

## 2024-06-10 DIAGNOSIS — G20A2 Parkinson's disease without dyskinesia, with fluctuations: Secondary | ICD-10-CM | POA: Diagnosis not present

## 2024-06-10 DIAGNOSIS — G20A1 Parkinson's disease without dyskinesia, without mention of fluctuations: Secondary | ICD-10-CM | POA: Diagnosis not present

## 2024-06-10 DIAGNOSIS — R413 Other amnesia: Secondary | ICD-10-CM | POA: Diagnosis not present

## 2024-06-10 DIAGNOSIS — R41841 Cognitive communication deficit: Secondary | ICD-10-CM | POA: Diagnosis not present

## 2024-06-16 DIAGNOSIS — B029 Zoster without complications: Secondary | ICD-10-CM | POA: Diagnosis not present

## 2024-06-16 DIAGNOSIS — I10 Essential (primary) hypertension: Secondary | ICD-10-CM | POA: Diagnosis not present

## 2024-07-05 DIAGNOSIS — R413 Other amnesia: Secondary | ICD-10-CM | POA: Diagnosis not present

## 2024-07-05 DIAGNOSIS — G20A2 Parkinson's disease without dyskinesia, with fluctuations: Secondary | ICD-10-CM | POA: Diagnosis not present

## 2024-07-05 DIAGNOSIS — R41841 Cognitive communication deficit: Secondary | ICD-10-CM | POA: Diagnosis not present

## 2024-07-05 DIAGNOSIS — F0280 Dementia in other diseases classified elsewhere without behavioral disturbance: Secondary | ICD-10-CM | POA: Diagnosis not present

## 2024-07-19 ENCOUNTER — Encounter: Payer: Self-pay | Admitting: Neurology

## 2024-07-21 ENCOUNTER — Other Ambulatory Visit: Payer: Self-pay

## 2024-07-21 DIAGNOSIS — G20A1 Parkinson's disease without dyskinesia, without mention of fluctuations: Secondary | ICD-10-CM

## 2024-07-21 MED ORDER — CARBIDOPA-LEVODOPA ER 25-100 MG PO TBCR: EXTENDED_RELEASE_TABLET | ORAL | 0 refills | Status: DC

## 2024-08-03 ENCOUNTER — Encounter: Payer: Self-pay | Admitting: Neurology

## 2024-08-11 DIAGNOSIS — R7303 Prediabetes: Secondary | ICD-10-CM | POA: Diagnosis not present

## 2024-08-11 DIAGNOSIS — I10 Essential (primary) hypertension: Secondary | ICD-10-CM | POA: Diagnosis not present

## 2024-08-11 DIAGNOSIS — G40909 Epilepsy, unspecified, not intractable, without status epilepticus: Secondary | ICD-10-CM | POA: Diagnosis not present

## 2024-08-11 DIAGNOSIS — N1831 Chronic kidney disease, stage 3a: Secondary | ICD-10-CM | POA: Diagnosis not present

## 2024-08-11 DIAGNOSIS — E785 Hyperlipidemia, unspecified: Secondary | ICD-10-CM | POA: Diagnosis not present

## 2024-08-11 DIAGNOSIS — D539 Nutritional anemia, unspecified: Secondary | ICD-10-CM | POA: Diagnosis not present

## 2024-08-11 DIAGNOSIS — Z23 Encounter for immunization: Secondary | ICD-10-CM | POA: Diagnosis not present

## 2024-08-11 DIAGNOSIS — M810 Age-related osteoporosis without current pathological fracture: Secondary | ICD-10-CM | POA: Diagnosis not present

## 2024-08-11 DIAGNOSIS — G20A1 Parkinson's disease without dyskinesia, without mention of fluctuations: Secondary | ICD-10-CM | POA: Diagnosis not present

## 2024-08-11 DIAGNOSIS — Z6827 Body mass index (BMI) 27.0-27.9, adult: Secondary | ICD-10-CM | POA: Diagnosis not present

## 2024-09-27 DIAGNOSIS — L299 Pruritus, unspecified: Secondary | ICD-10-CM | POA: Diagnosis not present

## 2024-10-03 ENCOUNTER — Encounter: Payer: Self-pay | Admitting: Neurology

## 2024-10-23 ENCOUNTER — Encounter: Payer: Self-pay | Admitting: Neurology

## 2024-10-26 ENCOUNTER — Other Ambulatory Visit: Payer: Self-pay

## 2024-10-26 DIAGNOSIS — G20A1 Parkinson's disease without dyskinesia, without mention of fluctuations: Secondary | ICD-10-CM

## 2024-10-26 MED ORDER — CARBIDOPA-LEVODOPA ER 25-100 MG PO TBCR: EXTENDED_RELEASE_TABLET | ORAL | 0 refills | Status: AC

## 2024-10-31 ENCOUNTER — Other Ambulatory Visit: Payer: Self-pay | Admitting: Neurology

## 2024-11-22 ENCOUNTER — Ambulatory Visit: Admitting: Neurology

## 2024-11-24 ENCOUNTER — Encounter: Payer: Self-pay | Admitting: Neurology

## 2024-11-25 MED ORDER — CLONAZEPAM 0.5 MG PO TABS: ORAL_TABLET | ORAL | 0 refills | Status: AC

## 2024-11-28 NOTE — Progress Notes (Unsigned)
 "   Assessment/Plan:   1.  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    -donepezil  with nightmares so has been d/c.  Will monitor as patient doing well for now  2.  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  Several times she has tried to go up to 1 tablet at bedtime, but ultimately ended up coming back down to half a tablet because of hangover effect.  PDMP is reviewed.  Last refill was 02/23/24.  3.  History of mental status change, possible seizure  -Presented to the hospital in October, 2020 with mental status change, receptive aphasia and hemianopsia.  Received TPA.  MRI was negative.  EEG with transient sharps in the left temporal region, felt to be a normal variant.  Patient was nonetheless started on Keppra , which had to be decreased because of side effects.  They ended up self discontinuing the Keppra  and she has done okay.   Subjective:   Belinda Davis was seen today in follow up for Parkinsons disease.  My previous records were reviewed prior to todays visit as well as outside records available to me.  spouse accompanies the patient and supplements history.  Patient is taking her levodopa  faithfully.  She has not had falls.  Her memory has not been great; sometimes she has difficulty doing things like using the seatbelt in a car.  Her husband will help her with those tasks.  She had side effects with donepezil  in the past.  Her husband emailed in November and wanted to know if Crexont  would help with that, but we told him it would not.  Current prescribed movement disorder medications: Clonazepam  0.5 mg, half tablet at bedtime (higher dosages with hangover effect) Carbidopa /levodopa  25/100 CR, 1 tablet at 8 AM/noon/4 PM/8 PM  Prior meds:  buspar , 5mg  (felt loopy but only took 1 or 2 tablets); Keppra  (tolerated well at low dose); donepezil  (nightmares)  ALLERGIES:  No  Known Allergies  CURRENT MEDICATIONS:  Outpatient Encounter Medications as of 11/30/2024  Medication Sig   amLODipine (NORVASC) 5 MG tablet Take 5 mg by mouth daily.   aspirin  EC 81 MG EC tablet Take 1 tablet (81 mg total) by mouth daily.   Carbidopa -Levodopa  ER (SINEMET  CR) 25-100 MG tablet controlled release TAKE 1 TABLET BY MOUTH 4 TIMES DAILY AT  8AM,  NOON,  4PM  AND  8PM   Cholecalciferol (VITAMIN D-3 PO) Take 1 capsule by mouth at bedtime.   clonazePAM  (KLONOPIN ) 0.5 MG tablet TAKE 1/2 (ONE-HALF) TABLET BY MOUTH AT BEDTIME   losartan (COZAAR) 100 MG tablet Take 100 mg by mouth at bedtime.    pravastatin  (PRAVACHOL ) 40 MG tablet Take 40 mg by mouth at bedtime.    No facility-administered encounter medications on file as of 11/30/2024.    Objective:   PHYSICAL EXAMINATION:    VITALS:   There were no vitals filed for this visit.   GEN:  The patient appears stated age and is in NAD. HEENT:  Normocephalic, atraumatic.  The mucous membranes are moist. The superficial temporal arteries are without ropiness or tenderness. CV:  RRR Lungs:  CTAB Neck/HEME:  There are no carotid bruits bilaterally.  Neurological examination:  Orientation: The patient is alert and oriented to person and place.  Remembers this examiner again and asks about her children, as she did previous Cranial nerves: There is good facial symmetry  with mild facial hypomimia. The speech is fluent and clear. Soft palate rises symmetrically and there is no tongue deviation. Hearing is intact to conversational tone. Sensation: Sensation is intact to light touch throughout Motor: Strength is at least antigravity x 4  Movement examination: Tone: There is normal tone in the upper and lower extremities. Abnormal movements: There is rare LUE rest tremor Coordination:  There is no decremation with any form of RAMS, including alternating supination and pronation of the forearm, hand opening and closing, finger taps, heel  taps and toe taps.  Gait and Station: The patient pushes off to arise.  She is ambulating well today  I have reviewed and interpreted the following labs independently    Chemistry      Component Value Date/Time   NA 142 10/04/2019 0855   K 4.5 10/04/2019 0855   CL 107 10/04/2019 0855   CO2 25 10/04/2019 0855   BUN 21 10/04/2019 0855   CREATININE 1.47 (H) 10/04/2019 0855      Component Value Date/Time   CALCIUM 9.5 10/04/2019 0855   ALKPHOS 58 09/13/2019 1822   AST 14 10/04/2019 0855   ALT 7 10/04/2019 0855   BILITOT 0.8 10/04/2019 0855       Lab Results  Component Value Date   WBC 5.4 10/04/2019   HGB 11.6 (L) 10/04/2019   HCT 35.2 10/04/2019   MCV 95.4 10/04/2019   PLT 199 10/04/2019    No results found for: TSH  Total time spent on today's visit was *** minutes, including both face-to-face time and nonface-to-face time.  Time included that spent on review of records (prior notes available to me/labs/imaging if pertinent), discussing treatment and goals, answering patient's questions and coordinating care.    Cc:  Moon, Amy A, NP "

## 2024-11-30 ENCOUNTER — Ambulatory Visit: Admitting: Neurology

## 2024-11-30 ENCOUNTER — Encounter: Payer: Self-pay | Admitting: Neurology

## 2024-11-30 VITALS — BP 126/84 | HR 80 | Wt 148.6 lb

## 2024-11-30 DIAGNOSIS — G4761 Periodic limb movement disorder: Secondary | ICD-10-CM

## 2024-11-30 DIAGNOSIS — F0280 Dementia in other diseases classified elsewhere without behavioral disturbance: Secondary | ICD-10-CM

## 2024-11-30 DIAGNOSIS — G20A1 Parkinson's disease without dyskinesia, without mention of fluctuations: Secondary | ICD-10-CM

## 2024-11-30 NOTE — Patient Instructions (Addendum)
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 She has not experienced any falls since her last visit and is satisfied with her current medication regimen. She experiences mild tremors and occasional nighttime awakenings but remains active and engaged in cognitive activities.  YOUR PLAN: -PARKINSON'S DISEASE WITH DEMENTIA: Parkinson's disease with dementia is a condition that affects movement and memory. Your symptoms are well-controlled with your current medication, carbidopa -levodopa  extended release. Continue taking this medication as prescribed. Regular mental stimulation through puzzles, reading, and social engagement is recommended. Physical activity is also important to support both cognitive and motor functions. Maintain a healthy diet, exercise regularly, and engage in cognitive activities to support your memory. Crexont  is not necessary as it does not provide additional benefits over your current therapy. Maintain a regular sleep-wake schedule and avoid excessive daytime napping.  -PERIODIC LIMB MOVEMENT DISORDER and INSOMNIA: Periodic limb movement disorder is a condition that causes involuntary movements of the limbs during sleep. You are currently taking clonazepam  and melatonin to manage this condition. Continue taking clonazepam  0.5 mg (half tablet) at bedtime and melatonin at bedtime. If needed, you can increase the melatonin dose to 8 mg for better sleep. Limit daytime naps to before 2 PM and keep them under one hour. Follow good sleep hygiene practices and maintain a regular sleep-wake schedule.  INSTRUCTIONS: Continue your current medications as prescribed. If you experience any changes in your symptoms or have concerns, please schedule a follow-up appointment.                      Contains text generated by [employer]bridge.                                  Contains text generated by [employer]bridge.                                 Contains text generated by Abridge.   "

## 2025-05-30 ENCOUNTER — Ambulatory Visit: Payer: Self-pay | Admitting: Neurology
